# Patient Record
Sex: Female | Born: 1975 | Race: Black or African American | Hispanic: No | Marital: Married | Smoking: Never smoker
Health system: Southern US, Community
[De-identification: ages and names within clinical notes are randomized; demographics above are authoritative.]

## PROBLEM LIST (undated history)

## (undated) DIAGNOSIS — D759 Disease of blood and blood-forming organs, unspecified: Secondary | ICD-10-CM

## (undated) DIAGNOSIS — Z87442 Personal history of urinary calculi: Secondary | ICD-10-CM

## (undated) DIAGNOSIS — N200 Calculus of kidney: Secondary | ICD-10-CM

## (undated) DIAGNOSIS — I10 Essential (primary) hypertension: Secondary | ICD-10-CM

## (undated) DIAGNOSIS — M1990 Unspecified osteoarthritis, unspecified site: Secondary | ICD-10-CM

## (undated) DIAGNOSIS — Z21 Asymptomatic human immunodeficiency virus [HIV] infection status: Secondary | ICD-10-CM

## (undated) DIAGNOSIS — W3400XA Accidental discharge from unspecified firearms or gun, initial encounter: Secondary | ICD-10-CM

## (undated) DIAGNOSIS — B20 Human immunodeficiency virus [HIV] disease: Secondary | ICD-10-CM

## (undated) HISTORY — PX: ABDOMINAL SURGERY: SHX537

---

## 1995-11-25 DIAGNOSIS — W3400XA Accidental discharge from unspecified firearms or gun, initial encounter: Secondary | ICD-10-CM

## 1995-11-25 HISTORY — DX: Accidental discharge from unspecified firearms or gun, initial encounter: W34.00XA

## 1999-02-08 ENCOUNTER — Other Ambulatory Visit: Admission: RE | Admit: 1999-02-08 | Discharge: 1999-02-08 | Payer: Self-pay | Admitting: Obstetrics

## 1999-02-09 ENCOUNTER — Other Ambulatory Visit: Admission: RE | Admit: 1999-02-09 | Discharge: 1999-02-09 | Payer: Self-pay | Admitting: Obstetrics

## 2000-03-27 ENCOUNTER — Encounter (INDEPENDENT_AMBULATORY_CARE_PROVIDER_SITE_OTHER): Payer: Self-pay | Admitting: Specialist

## 2000-03-27 ENCOUNTER — Other Ambulatory Visit: Admission: RE | Admit: 2000-03-27 | Discharge: 2000-03-27 | Payer: Self-pay | Admitting: Obstetrics

## 2000-05-21 ENCOUNTER — Encounter: Admission: RE | Admit: 2000-05-21 | Discharge: 2000-05-21 | Payer: Self-pay | Admitting: Obstetrics

## 2000-06-15 ENCOUNTER — Ambulatory Visit (HOSPITAL_COMMUNITY): Admission: RE | Admit: 2000-06-15 | Discharge: 2000-06-15 | Payer: Self-pay | Admitting: Obstetrics

## 2000-06-15 ENCOUNTER — Encounter (INDEPENDENT_AMBULATORY_CARE_PROVIDER_SITE_OTHER): Payer: Self-pay | Admitting: Specialist

## 2006-10-23 ENCOUNTER — Encounter: Admission: RE | Admit: 2006-10-23 | Discharge: 2006-10-23 | Payer: Self-pay | Admitting: Occupational Medicine

## 2007-06-04 ENCOUNTER — Encounter: Admission: RE | Admit: 2007-06-04 | Discharge: 2007-06-04 | Payer: Self-pay | Admitting: Occupational Medicine

## 2008-08-23 ENCOUNTER — Emergency Department (HOSPITAL_BASED_OUTPATIENT_CLINIC_OR_DEPARTMENT_OTHER): Admission: EM | Admit: 2008-08-23 | Discharge: 2008-08-23 | Payer: Self-pay | Admitting: Emergency Medicine

## 2008-11-18 ENCOUNTER — Emergency Department (HOSPITAL_BASED_OUTPATIENT_CLINIC_OR_DEPARTMENT_OTHER): Admission: EM | Admit: 2008-11-18 | Discharge: 2008-11-18 | Payer: Self-pay | Admitting: Emergency Medicine

## 2009-02-05 ENCOUNTER — Ambulatory Visit: Payer: Self-pay | Admitting: Diagnostic Radiology

## 2009-02-05 ENCOUNTER — Emergency Department (HOSPITAL_BASED_OUTPATIENT_CLINIC_OR_DEPARTMENT_OTHER): Admission: EM | Admit: 2009-02-05 | Discharge: 2009-02-05 | Payer: Self-pay | Admitting: Emergency Medicine

## 2009-06-27 ENCOUNTER — Emergency Department (HOSPITAL_BASED_OUTPATIENT_CLINIC_OR_DEPARTMENT_OTHER): Admission: EM | Admit: 2009-06-27 | Discharge: 2009-06-27 | Payer: Self-pay | Admitting: Emergency Medicine

## 2009-10-15 ENCOUNTER — Emergency Department (HOSPITAL_BASED_OUTPATIENT_CLINIC_OR_DEPARTMENT_OTHER): Admission: EM | Admit: 2009-10-15 | Discharge: 2009-10-15 | Payer: Self-pay | Admitting: Emergency Medicine

## 2009-10-15 ENCOUNTER — Ambulatory Visit: Payer: Self-pay | Admitting: Diagnostic Radiology

## 2010-02-10 ENCOUNTER — Emergency Department (HOSPITAL_BASED_OUTPATIENT_CLINIC_OR_DEPARTMENT_OTHER): Admission: EM | Admit: 2010-02-10 | Discharge: 2010-02-11 | Payer: Self-pay | Admitting: Emergency Medicine

## 2010-04-18 ENCOUNTER — Emergency Department (HOSPITAL_BASED_OUTPATIENT_CLINIC_OR_DEPARTMENT_OTHER): Admission: EM | Admit: 2010-04-18 | Discharge: 2010-04-18 | Payer: Self-pay | Admitting: Emergency Medicine

## 2010-06-07 ENCOUNTER — Emergency Department (HOSPITAL_BASED_OUTPATIENT_CLINIC_OR_DEPARTMENT_OTHER): Admission: EM | Admit: 2010-06-07 | Discharge: 2010-06-07 | Payer: Self-pay | Admitting: Emergency Medicine

## 2010-07-21 ENCOUNTER — Emergency Department (HOSPITAL_BASED_OUTPATIENT_CLINIC_OR_DEPARTMENT_OTHER): Admission: EM | Admit: 2010-07-21 | Discharge: 2010-07-21 | Payer: Self-pay | Admitting: Emergency Medicine

## 2010-07-21 ENCOUNTER — Ambulatory Visit: Payer: Self-pay | Admitting: Diagnostic Radiology

## 2010-08-02 ENCOUNTER — Emergency Department (HOSPITAL_BASED_OUTPATIENT_CLINIC_OR_DEPARTMENT_OTHER): Admission: EM | Admit: 2010-08-02 | Discharge: 2010-08-03 | Payer: Self-pay | Admitting: Emergency Medicine

## 2010-08-03 ENCOUNTER — Ambulatory Visit: Payer: Self-pay | Admitting: Diagnostic Radiology

## 2010-12-26 ENCOUNTER — Emergency Department (HOSPITAL_BASED_OUTPATIENT_CLINIC_OR_DEPARTMENT_OTHER)
Admission: EM | Admit: 2010-12-26 | Discharge: 2010-12-27 | Disposition: A | Discharge status: Home / Self Care | Payer: Self-pay | Attending: Emergency Medicine | Admitting: Emergency Medicine

## 2010-12-26 DIAGNOSIS — L509 Urticaria, unspecified: Secondary | ICD-10-CM | POA: Insufficient documentation

## 2011-02-06 LAB — CBC
HCT: 34.3 % — ABNORMAL LOW (ref 36.0–46.0)
Hemoglobin: 11.7 g/dL — ABNORMAL LOW (ref 12.0–15.0)
MCV: 90.5 fL (ref 78.0–100.0)
RDW: 13.5 % (ref 11.5–15.5)
WBC: 6.6 10*3/uL (ref 4.0–10.5)

## 2011-02-06 LAB — LIPASE, BLOOD: Lipase: 52 U/L (ref 23–300)

## 2011-02-06 LAB — COMPREHENSIVE METABOLIC PANEL
ALT: 20 U/L (ref 0–35)
Alkaline Phosphatase: 59 U/L (ref 39–117)
BUN: 9 mg/dL (ref 6–23)
CO2: 29 mEq/L (ref 19–32)
Chloride: 106 mEq/L (ref 96–112)
GFR calc non Af Amer: >60 mL/min (ref >60)
Glucose, Bld: 95 mg/dL (ref 70–99)
Potassium: 4 mEq/L (ref 3.5–5.1)
Total Bilirubin: 1 mg/dL (ref 0.3–1.2)
Total Protein: 7.7 g/dL (ref 6.0–8.3)

## 2011-02-06 LAB — URINALYSIS, ROUTINE W REFLEX MICROSCOPIC
Glucose, UA: NEGATIVE mg/dL
Hgb urine dipstick: NEGATIVE
Protein, ur: NEGATIVE mg/dL
pH: 5.5 (ref 5.0–8.0)

## 2011-02-06 LAB — DIFFERENTIAL
Basophils Absolute: 0 10*3/uL (ref 0.0–0.1)
Basophils Relative: 1 % (ref 0–1)
Eosinophils Absolute: 0.1 10*3/uL (ref 0.0–0.7)
Neutro Abs: 3.9 10*3/uL (ref 1.7–7.7)
Neutrophils Relative %: 58 % (ref 43–77)

## 2011-02-06 LAB — WET PREP, GENITAL
Trich, Wet Prep: NONE SEEN
Yeast Wet Prep HPF POC: NONE SEEN

## 2011-04-11 NOTE — Op Note (Signed)
[facility]  Patient:    Robin Orozco, Robin Orozco                      MRN: [id number] Proc. Date: 06/15/00 Adm. Date:  [id number] Attending:  Tammi Sou                           Operative Report  PREOPERATIVE DIAGNOSIS:       Severe cervical dysplasia (CIN 3).  POSTOPERATIVE DIAGNOSIS:      Severe cervical dysplasia (CIN 3).  OPERATION:                    Loop electrosurgical excision procedure.  SURGEON:                      Charles A. Clearance Coots, M.D.  ASSISTANT:  ANESTHESIA:                   MAC with paracervical block.  ESTIMATED BLOOD LOSS:         Negligible.  COMPLICATIONS:                None.  SPECIMENS:                    Conization of cervix.  DESCRIPTION OF PROCEDURE:     The patient was brought to the operating room and after satisfactory IV sedation, the legs were brought up in stirrups and the vagina was prepped and draped in the usual sterile fashion.  A sterile LEEP speculum was inserted in the vaginal vault and the cervix was isolated. Solution of 2% Xylocaine with 1:200,000 epinephrine was injected in the cervical stroma throughout for hemostasis and analgesia.  A 15 x 12 mm loop was then used to obtain conization of the cervix in routine fashion at a power wattage of 75 watts cutting and 50 watts coagulation.  The specimen was labeled at 12 oclock with a stitch of Vicryl suture and was submitted to pathology for evaluation.  The edges of the conization along the base were further coagulated with ball-tip electrode and hemostasis was excellent at the conclusion of the procedure.  All instruments were retired.  The patient tolerated the procedure well and was transported to the recovery room in satisfactory condition. DD:  06/15/00 TD:  06/17/00 Job: [id number]

## 2011-04-22 ENCOUNTER — Emergency Department (HOSPITAL_BASED_OUTPATIENT_CLINIC_OR_DEPARTMENT_OTHER)
Admission: EM | Admit: 2011-04-22 | Discharge: 2011-04-22 | Disposition: A | Discharge status: Home / Self Care | Payer: Self-pay | Attending: Emergency Medicine | Admitting: Emergency Medicine

## 2011-04-22 DIAGNOSIS — K089 Disorder of teeth and supporting structures, unspecified: Secondary | ICD-10-CM | POA: Insufficient documentation

## 2011-05-27 ENCOUNTER — Emergency Department (HOSPITAL_BASED_OUTPATIENT_CLINIC_OR_DEPARTMENT_OTHER)
Admission: EM | Admit: 2011-05-27 | Discharge: 2011-05-28 | Disposition: A | Discharge status: Home / Self Care | Payer: PRIVATE HEALTH INSURANCE | Attending: Emergency Medicine | Admitting: Emergency Medicine

## 2011-05-27 ENCOUNTER — Emergency Department (INDEPENDENT_AMBULATORY_CARE_PROVIDER_SITE_OTHER): Payer: PRIVATE HEALTH INSURANCE

## 2011-05-27 DIAGNOSIS — M79609 Pain in unspecified limb: Secondary | ICD-10-CM

## 2011-05-27 DIAGNOSIS — M545 Low back pain, unspecified: Secondary | ICD-10-CM

## 2011-09-15 ENCOUNTER — Emergency Department (INDEPENDENT_AMBULATORY_CARE_PROVIDER_SITE_OTHER): Payer: PRIVATE HEALTH INSURANCE

## 2011-09-15 ENCOUNTER — Encounter: Payer: Self-pay | Admitting: *Deleted

## 2011-09-15 ENCOUNTER — Emergency Department (HOSPITAL_BASED_OUTPATIENT_CLINIC_OR_DEPARTMENT_OTHER)
Admission: EM | Admit: 2011-09-15 | Discharge: 2011-09-15 | Disposition: A | Discharge status: Home / Self Care | Payer: PRIVATE HEALTH INSURANCE | Attending: Emergency Medicine | Admitting: Emergency Medicine

## 2011-09-15 DIAGNOSIS — R51 Headache: Secondary | ICD-10-CM

## 2011-09-15 DIAGNOSIS — R519 Headache, unspecified: Secondary | ICD-10-CM

## 2011-09-15 LAB — CSF CELL COUNT WITH DIFFERENTIAL
RBC Count, CSF: 43 /mm3 — ABNORMAL HIGH
Tube #: 1
Tube #: 4
WBC, CSF: 1 /mm3 (ref 0–5)

## 2011-09-15 LAB — PROTEIN, CSF: Total  Protein, CSF: 26 mg/dL (ref 15–45)

## 2011-09-15 LAB — GRAM STAIN

## 2011-09-15 MED ORDER — METOCLOPRAMIDE HCL 5 MG/ML IJ SOLN
10.0000 mg | Freq: Once | INTRAMUSCULAR | Status: AC
  Administered 2011-09-15: 10 mg via INTRAVENOUS
  Filled 2011-09-15: qty 2

## 2011-09-15 MED ORDER — DEXAMETHASONE SODIUM PHOSPHATE 10 MG/ML IJ SOLN
10.0000 mg | Freq: Once | INTRAMUSCULAR | Status: AC
  Administered 2011-09-15: 10 mg via INTRAVENOUS
  Filled 2011-09-15: qty 1

## 2011-09-15 MED ORDER — DIPHENHYDRAMINE HCL 50 MG/ML IJ SOLN
25.0000 mg | Freq: Once | INTRAMUSCULAR | Status: AC
  Administered 2011-09-15: 25 mg via INTRAVENOUS
  Filled 2011-09-15: qty 1

## 2011-09-15 MED ORDER — SODIUM CHLORIDE 0.9 % IV BOLUS (SEPSIS)
1000.0000 mL | Freq: Once | INTRAVENOUS | Status: AC
  Administered 2011-09-15: 1000 mL via INTRAVENOUS

## 2011-09-15 MED ORDER — METOCLOPRAMIDE HCL 5 MG/ML IJ SOLN
INTRAMUSCULAR | Status: AC
  Administered 2011-09-15: 10 mg
  Filled 2011-09-15: qty 2

## 2011-09-15 NOTE — ED Provider Notes (Addendum)
History     CSN: [id number] Arrival date & time: 09/15/2011  1:41 AM   First MD Initiated Contact with Patient 09/15/11 0240      Chief Complaint  Patient presents with  . Headache    (Consider location/radiation/quality/duration/timing/severity/associated sxs/prior treatment) Patient is a 35 y.o. female presenting with headaches. The history is provided by the patient.  Headache  This is a new problem. Pertinent negatives include no fever, no palpitations, no shortness of breath and no vomiting.   sudden onset headache, hurts all over with no history of similar headaches in the past. Severe in nature. Described as sharp and throbbing. Constant since onset today. No radiation. No neck pain or fevers. No rash or sore throat. No recent illness. Patient is still nauseated but has not vomited. She also has recurrent right lower back pain and today without trauma that she does not believe is associated with her headache. No incontinence or weakness or numbness.  Onset: today Location: entire head Radiation: none Quality: sharp/ throbbing Timing: constant Duration: persistent since onset Severity: 10/10 Associated symptoms: nausea No prior history of same.   History reviewed. No pertinent past medical history.  History reviewed. No pertinent past surgical history.  No family history on file.  History  Substance Use Topics  . Smoking status: Never Smoker   . Smokeless tobacco: Not on file  . Alcohol Use: No    OB History    Grav Para Term Preterm Abortions TAB SAB Ect Mult Living                  Review of Systems  Constitutional: Negative for fever and chills.  HENT: Negative for neck pain, neck stiffness and sinus pressure.   Eyes: Negative for pain.  Respiratory: Negative for cough and shortness of breath.   Cardiovascular: Negative for chest pain, palpitations and leg swelling.  Gastrointestinal: Negative for vomiting and abdominal pain.  Genitourinary: Negative  for dysuria.  Musculoskeletal: Negative for back pain.  Skin: Negative for rash.  Neurological: Positive for headaches. Negative for syncope, speech difficulty and numbness.  All other systems reviewed and are negative.    Allergies  Aspirin and Toradol  Home Medications  No current outpatient prescriptions on file.  BP 121/73  Pulse 93  Temp(Src) 98.5 F (36.9 C) (Oral)  Resp 18  SpO2 99%  Physical Exam  Constitutional: She is oriented to person, place, and time. She appears well-developed and well-nourished.  HENT:  Head: Normocephalic and atraumatic.  Eyes: Conjunctivae and EOM are normal. Pupils are equal, round, and reactive to light.  Neck: Full passive range of motion without pain. Neck supple. No thyromegaly present.       No meningismus  Cardiovascular: Normal rate, regular rhythm, S1 normal, S2 normal and intact distal pulses.   Pulmonary/Chest: Effort normal and breath sounds normal.  Abdominal: Soft. Bowel sounds are normal. There is no tenderness. There is no CVA tenderness.  Musculoskeletal: Normal range of motion.  Neurological: She is alert and oriented to person, place, and time. She has normal strength and normal reflexes. No cranial nerve deficit or sensory deficit. She displays a negative Romberg sign. GCS eye subscore is 4. GCS verbal subscore is 5. GCS motor subscore is 6.       Normal Gait  Skin: Skin is warm and dry. No rash noted. No cyanosis. Nails show no clubbing.  Psychiatric: She has a normal mood and affect. Her speech is normal and behavior is normal.  ED Course  LUMBAR PUNCTURE Date/Time: 09/15/2011 5:15 AM Performed by: Sunnie Nielsen Authorized by: Sunnie Nielsen Consent: Verbal consent obtained. Risks and benefits: risks, benefits and alternatives were discussed Consent given by: patient Patient understanding: patient states understanding of the procedure being performed Patient consent: the patient's understanding of the procedure  matches consent given Procedure consent: procedure consent matches procedure scheduled Relevant documents: relevant documents present and verified Test results: test results available and properly labeled Site marked: the operative site was marked Imaging studies: imaging studies available Patient identity confirmed: verbally with patient Time out: Immediately prior to procedure a "time out" was called to verify the correct patient, procedure, equipment, support staff and site/side marked as required. Indications: evaluation for subarachnoid hemorrhage Anesthesia: local infiltration Local anesthetic: lidocaine 1% without epinephrine Anesthetic total: 2 ml Preparation: Patient was prepped and draped in the usual sterile fashion. Lumbar space: L4-L5 interspace Patient's position: sitting Needle gauge: 20 Needle type: spinal needle - Quincke tip Number of attempts: 1 Fluid appearance: clear Tubes of fluid: 4 Total volume: 4 ml Post-procedure: site cleaned and adhesive bandage applied Patient tolerance: Patient tolerated the procedure well with no immediate complications.   (including critical care time)  Results for orders placed during the hospital encounter of 09/15/11  CSF CELL COUNT WITH DIFFERENTIAL      Component Value Range   Tube # 1     Color, CSF COLORLESS  COLORLESS    Appearance, CSF CLEAR  CLEAR    Supernatant NOT INDICATED     RBC Count 43 (*) 0 (/cu mm)   WBC, CSF 1  0 - 5 (/cu mm)   Segmented Neutrophils-CSF RARE  0 - 6 (%)   Monocyte-Macrophage-Spinal Fluid RARE  15 - 45 (%)  GLUCOSE, CSF      Component Value Range   Glucose, CSF 70  43 - 76 (mg/dL)  PROTEIN, CSF      Component Value Range   Total  Protein, CSF 26  15 - 45 (mg/dL)  CSF CELL COUNT WITH DIFFERENTIAL      Component Value Range   Tube # 4     Color, CSF COLORLESS  COLORLESS    Appearance, CSF CLEAR  CLEAR    Supernatant NOT INDICATED     RBC Count 1 (*) 0 (/cu mm)   WBC, CSF 0  0 - 5 (/cu  mm)   Segmented Neutrophils-CSF TOO FEW TO COUNT, SMEAR AVAILABLE FOR REVIEW  0 - 6 (%)   Monocyte-Macrophage-Spinal Fluid RARE  15 - 45 (%)  GRAM STAIN      Component Value Range   Specimen Description CSF     Special Requests NONE     Gram Stain       Value: CYTOSPIN PREP SLIDE     WBC PRESENT, PREDOMINANTLY MONONUCLEAR     NO ORGANISMS SEEN   Report Status 09/15/2011 FINAL     Ct Head Wo Contrast  09/15/2011  *RADIOLOGY REPORT*  Clinical Data: Headache.  CT HEAD WITHOUT CONTRAST  Technique:  Contiguous axial images were obtained from the base of the skull through the vertex without contrast.  Comparison: None.  Findings: There is no evidence of acute infarction, mass lesion, or intra- or extra-axial hemorrhage on CT.  The posterior fossa, including the cerebellum, brainstem and fourth ventricle, is within normal limits.  The third and lateral ventricles, and basal ganglia are unremarkable in appearance.  The cerebral hemispheres are symmetric in appearance, with normal gray- white differentiation.  No mass effect or midline shift is seen.  There is no evidence of fracture; visualized osseous structures are unremarkable in appearance.  The visualized portions of the orbits are within normal limits.  The paranasal sinuses and mastoid air cells are well-aerated.  No significant soft tissue abnormalities are seen.  IMPRESSION: Unremarkable noncontrast CT of the head.  Original Report Authenticated By: Tonia Ghent, M.D.          MDM   Sudden onset severe headache with no history of same. Patient states she has a cousin with history of aneurysm. CT scan obtained and reviewed as above and given concern for possible subarachnoid hemorrhage, patient was consented for LP. Clear CSF obtained and sent to lab for microscopic evaluation. IV fluids and had a cocktail provided which improved her symptoms. No meningismus or neuro deficits.        Sunnie Nielsen, MD 09/15/11 1610  Sunnie Nielsen,  MD 09/18/11 [phone]

## 2011-09-15 NOTE — ED Notes (Signed)
Patient states she has a headache, feels "sick to her stomach"  And has pain going down her right leg.

## 2011-09-15 NOTE — ED Notes (Signed)
Pt returned from CT °

## 2011-09-15 NOTE — ED Notes (Signed)
Pt resting comfortably with eyes closed resp even and non-labored pain improved delay explained will continue to monitor

## 2011-09-15 NOTE — ED Notes (Signed)
Pt ambulated without difficulty alert and oriented BP improved pain improved will continue to monitor

## 2011-09-16 ENCOUNTER — Emergency Department (HOSPITAL_BASED_OUTPATIENT_CLINIC_OR_DEPARTMENT_OTHER): Payer: PRIVATE HEALTH INSURANCE

## 2011-09-16 ENCOUNTER — Emergency Department (HOSPITAL_BASED_OUTPATIENT_CLINIC_OR_DEPARTMENT_OTHER)
Admission: EM | Admit: 2011-09-16 | Discharge: 2011-09-16 | Disposition: A | Discharge status: Home / Self Care | Payer: PRIVATE HEALTH INSURANCE | Attending: Emergency Medicine | Admitting: Emergency Medicine

## 2011-09-16 ENCOUNTER — Emergency Department (INDEPENDENT_AMBULATORY_CARE_PROVIDER_SITE_OTHER): Payer: PRIVATE HEALTH INSURANCE

## 2011-09-16 ENCOUNTER — Encounter (HOSPITAL_BASED_OUTPATIENT_CLINIC_OR_DEPARTMENT_OTHER): Payer: Self-pay | Admitting: *Deleted

## 2011-09-16 DIAGNOSIS — Y849 Medical procedure, unspecified as the cause of abnormal reaction of the patient, or of later complication, without mention of misadventure at the time of the procedure: Secondary | ICD-10-CM

## 2011-09-16 DIAGNOSIS — M5126 Other intervertebral disc displacement, lumbar region: Secondary | ICD-10-CM | POA: Insufficient documentation

## 2011-09-16 DIAGNOSIS — M545 Low back pain, unspecified: Secondary | ICD-10-CM

## 2011-09-16 DIAGNOSIS — G971 Other reaction to spinal and lumbar puncture: Secondary | ICD-10-CM | POA: Insufficient documentation

## 2011-09-16 DIAGNOSIS — M5116 Intervertebral disc disorders with radiculopathy, lumbar region: Secondary | ICD-10-CM

## 2011-09-16 DIAGNOSIS — M549 Dorsalgia, unspecified: Secondary | ICD-10-CM | POA: Insufficient documentation

## 2011-09-16 MED ORDER — OXYCODONE-ACETAMINOPHEN 7.5-325 MG PO TABS: 1.0000 | ORAL_TABLET | ORAL | Status: AC | PRN

## 2011-09-16 MED ORDER — HYDROMORPHONE HCL 1 MG/ML IJ SOLN
1.0000 mg | Freq: Once | INTRAMUSCULAR | Status: AC
  Administered 2011-09-16: 1 mg via INTRAVENOUS
  Filled 2011-09-16: qty 1

## 2011-09-16 MED ORDER — GADOBENATE DIMEGLUMINE 529 MG/ML IV SOLN
18.0000 mL | Freq: Once | INTRAVENOUS | Status: AC | PRN
Start: 2011-09-16 — End: 2011-09-16
  Administered 2011-09-16: 18 mL via INTRAVENOUS

## 2011-09-16 MED ORDER — DIAZEPAM 5 MG PO TABS: 5.0000 mg | ORAL_TABLET | Freq: Two times a day (BID) | ORAL | Status: AC

## 2011-09-16 MED ORDER — ONDANSETRON 8 MG PO TBDP
ORAL_TABLET | ORAL | Status: AC
  Administered 2011-09-16: 8 mg via ORAL
  Filled 2011-09-16: qty 1

## 2011-09-16 MED ORDER — ONDANSETRON HCL 4 MG/2ML IJ SOLN
4.0000 mg | Freq: Once | INTRAMUSCULAR | Status: AC
  Administered 2011-09-16: 4 mg via INTRAVENOUS
  Filled 2011-09-16: qty 2

## 2011-09-16 MED ORDER — SODIUM CHLORIDE 0.9 % IV BOLUS (SEPSIS)
1000.0000 mL | Freq: Once | INTRAVENOUS | Status: AC
  Administered 2011-09-16: 1000 mL via INTRAVENOUS

## 2011-09-16 MED ORDER — ONDANSETRON 8 MG PO TBDP
8.0000 mg | ORAL_TABLET | Freq: Once | ORAL | Status: AC
  Administered 2011-09-16: 8 mg via ORAL

## 2011-09-16 NOTE — ED Notes (Signed)
Pt returned from MRI via w/c.

## 2011-09-16 NOTE — ED Notes (Signed)
Pt requested and up to BR between med admn-slow steady gait

## 2011-09-16 NOTE — ED Notes (Signed)
Pt taken to MRI via w/c-tech was advised pt given pain med/dilaudid and nausea med/zofran

## 2011-09-16 NOTE — ED Provider Notes (Signed)
History     CSN: [id number] Arrival date & time: 09/16/2011  3:10 PM   First MD Initiated Contact with Patient 09/16/11 1523      Chief Complaint  Patient presents with  . Back Pain    (Consider location/radiation/quality/duration/timing/severity/associated sxs/prior treatment) Patient is a 35 y.o. female presenting with back pain. The history is provided by the patient.  Back Pain    patient seen yesterday for headache and had a lumbar puncture. Since that time she has had lower back pain radiating down to her legs. She denies any fever or vomiting denies any photophobia and headache is the same. No loss of bowel or bladder function. Pain is localized to her lower lumbar sacral area and radiates to both legs is described as being moderate and sharp. no medications taken for pain prior to arrival.  History reviewed. No pertinent past medical history.  History reviewed. No pertinent past surgical history.  History reviewed. No pertinent family history.  History  Substance Use Topics  . Smoking status: Never Smoker   . Smokeless tobacco: Not on file  . Alcohol Use: No    OB History    Grav Para Term Preterm Abortions TAB SAB Ect Mult Living                  Review of Systems  Musculoskeletal: Positive for back pain.  All other systems reviewed and are negative.    Allergies  Toradol and Aspirin  Home Medications   Current Outpatient Rx  Name Route Sig Dispense Refill  . IBUPROFEN 200 MG PO TABS Oral Take 800 mg by mouth every 6 (six) hours as needed. For pain     . VISINE OP Right Eye Place 1 drop into the right eye daily as needed. For irritated eyes       BP 131/71  Pulse 82  Temp(Src) 98.7 F (37.1 C) (Oral)  Resp 16  Ht 5\' 6"  (1.676 m)  Wt 195 lb (88.451 kg)  BMI 31.47 kg/m2  SpO2 100%  LMP 08/16/2011  Physical Exam  Nursing note and vitals reviewed. Constitutional: She is oriented to person, place, and time. Vital signs are normal. She appears  well-developed and well-nourished.  Non-toxic appearance. No distress.  HENT:  Head: Normocephalic and atraumatic.  Eyes: Conjunctivae and EOM are normal. Pupils are equal, round, and reactive to light.  Neck: Normal range of motion. Neck supple. No tracheal deviation present.  Cardiovascular: Normal rate, regular rhythm and normal heart sounds.  Exam reveals no gallop.   No murmur heard. Pulmonary/Chest: Effort normal and breath sounds normal. No stridor. No respiratory distress. She has no wheezes.  Abdominal: Soft. Normal appearance and bowel sounds are normal. She exhibits no distension. There is no tenderness. There is no rebound.  Musculoskeletal: Normal range of motion. She exhibits no edema and no tenderness.       Lumbar back: She exhibits tenderness, pain and spasm. She exhibits no bony tenderness.       Back:  Neurological: She is alert and oriented to person, place, and time. She has normal strength. She displays no tremor and normal reflexes. No cranial nerve deficit or sensory deficit. She exhibits normal muscle tone. GCS eye subscore is 4. GCS verbal subscore is 5. GCS motor subscore is 6. She displays no Babinski's sign on the right side. She displays no Babinski's sign on the left side.  Reflex Scores:      Patellar reflexes are 4+ on the right side  and 4+ on the left side. Skin: Skin is warm and dry. No rash noted.  Psychiatric: She has a normal mood and affect. Her speech is normal and behavior is normal.    ED Course  Procedures (including critical care time)  Labs Reviewed - No data to display Ct Head Wo Contrast  09/15/2011  *RADIOLOGY REPORT*  Clinical Data: Headache.  CT HEAD WITHOUT CONTRAST  Technique:  Contiguous axial images were obtained from the base of the skull through the vertex without contrast.  Comparison: None.  Findings: There is no evidence of acute infarction, mass lesion, or intra- or extra-axial hemorrhage on CT.  The posterior fossa, including the  cerebellum, brainstem and fourth ventricle, is within normal limits.  The third and lateral ventricles, and basal ganglia are unremarkable in appearance.  The cerebral hemispheres are symmetric in appearance, with normal gray- white differentiation.  No mass effect or midline shift is seen.  There is no evidence of fracture; visualized osseous structures are unremarkable in appearance.  The visualized portions of the orbits are within normal limits.  The paranasal sinuses and mastoid air cells are well-aerated.  No significant soft tissue abnormalities are seen.  IMPRESSION: Unremarkable noncontrast CT of the head.  Original Report Authenticated By: Tonia Ghent, M.D.     No diagnosis found.    MDM  3:54 PM Patient's CSF results were reviewed from yesterday and are negative at 24 hours      8:30 PM Pt rechecked and feels better after pain medications--repeat neuro exam stable, awaiting mri results 8:56 PM Ct Head Wo Contrast  09/15/2011  *RADIOLOGY REPORT*  Clinical Data: Headache.  CT HEAD WITHOUT CONTRAST  Technique:  Contiguous axial images were obtained from the base of the skull through the vertex without contrast.  Comparison: None.  Findings: There is no evidence of acute infarction, mass lesion, or intra- or extra-axial hemorrhage on CT.  The posterior fossa, including the cerebellum, brainstem and fourth ventricle, is within normal limits.  The third and lateral ventricles, and basal ganglia are unremarkable in appearance.  The cerebral hemispheres are symmetric in appearance, with normal gray- white differentiation.  No mass effect or midline shift is seen.  There is no evidence of fracture; visualized osseous structures are unremarkable in appearance.  The visualized portions of the orbits are within normal limits.  The paranasal sinuses and mastoid air cells are well-aerated.  No significant soft tissue abnormalities are seen.  IMPRESSION: Unremarkable noncontrast CT of the head.   Original Report Authenticated By: Tonia Ghent, M.D.   Patient's MRI results do not show any signs of infection however did show disc protrusion at the L4-L5 area. Patient feels better will be discharged home  Toy Baker, MD 09/16/11 2057

## 2011-09-16 NOTE — ED Notes (Signed)
Pt c/o here yesterday for h/a and received a spinal tap, since then c/o lower back pain and bil leg pain

## 2011-09-16 NOTE — ED Notes (Signed)
Pt given saltines and sprite per request after ok by EDP

## 2011-09-16 NOTE — ED Notes (Signed)
Called into pt's room-pt c/o cont'd nausea and feeling hot-VSS-pt with 2 blankets-1 blanket removed-EDP notified of pt c/o

## 2011-09-18 LAB — CSF CULTURE W GRAM STAIN: Culture: NO GROWTH

## 2011-11-25 DIAGNOSIS — N200 Calculus of kidney: Secondary | ICD-10-CM

## 2011-11-25 HISTORY — DX: Calculus of kidney: N20.0

## 2012-04-07 ENCOUNTER — Emergency Department (HOSPITAL_BASED_OUTPATIENT_CLINIC_OR_DEPARTMENT_OTHER)
Admission: EM | Admit: 2012-04-07 | Discharge: 2012-04-07 | Disposition: A | Discharge status: Home / Self Care | Payer: PRIVATE HEALTH INSURANCE | Attending: Emergency Medicine | Admitting: Emergency Medicine

## 2012-04-07 ENCOUNTER — Encounter (HOSPITAL_BASED_OUTPATIENT_CLINIC_OR_DEPARTMENT_OTHER): Payer: Self-pay

## 2012-04-07 ENCOUNTER — Emergency Department (HOSPITAL_BASED_OUTPATIENT_CLINIC_OR_DEPARTMENT_OTHER): Payer: PRIVATE HEALTH INSURANCE

## 2012-04-07 DIAGNOSIS — S8000XA Contusion of unspecified knee, initial encounter: Secondary | ICD-10-CM

## 2012-04-07 DIAGNOSIS — M7989 Other specified soft tissue disorders: Secondary | ICD-10-CM | POA: Insufficient documentation

## 2012-04-07 DIAGNOSIS — IMO0002 Reserved for concepts with insufficient information to code with codable children: Secondary | ICD-10-CM | POA: Insufficient documentation

## 2012-04-07 DIAGNOSIS — M25569 Pain in unspecified knee: Secondary | ICD-10-CM | POA: Insufficient documentation

## 2012-04-07 MED ORDER — HYDROCODONE-ACETAMINOPHEN 5-500 MG PO TABS: 1.0000 | ORAL_TABLET | Freq: Four times a day (QID) | ORAL | Status: AC | PRN

## 2012-04-07 NOTE — ED Notes (Signed)
MD at bedside. 

## 2012-04-07 NOTE — ED Provider Notes (Addendum)
History     CSN: [id number]  Arrival date & time 04/07/12  4098   None     No chief complaint on file.   (Consider location/radiation/quality/duration/timing/severity/associated sxs/prior treatment) Patient is a 36 y.o. female presenting with knee pain. The history is provided by the patient.  Knee Pain This is a new problem. The current episode started yesterday (She was cleaning her bathroom yesterday and went to rush out and hit her knee on the toilet). The problem occurs constantly. The problem has been gradually worsening. Associated symptoms comments: No fever.  Pain with walking adn mild swelling.  No deformity. The symptoms are aggravated by bending, walking and standing. The symptoms are relieved by ice and rest. She has tried rest for the symptoms. The treatment provided mild relief.    No past medical history on file.  No past surgical history on file.  No family history on file.  History  Substance Use Topics  . Smoking status: Never Smoker   . Smokeless tobacco: Not on file  . Alcohol Use: No    OB History    Grav Para Term Preterm Abortions TAB SAB Ect Mult Living                  Review of Systems  Constitutional: Negative for fever.  Musculoskeletal: Positive for arthralgias. Negative for back pain and joint swelling.  All other systems reviewed and are negative.    Allergies  Ketorolac tromethamine and Aspirin  Home Medications   Current Outpatient Rx  Name Route Sig Dispense Refill  . IBUPROFEN 200 MG PO TABS Oral Take 800 mg by mouth every 6 (six) hours as needed. For pain     . VISINE OP Right Eye Place 1 drop into the right eye daily as needed. For irritated eyes       BP 161/84  Pulse 77  Temp(Src) 98 F (36.7 C) (Oral)  Resp 20  Ht 5\' 5"  (1.651 m)  Wt 180 lb (81.647 kg)  BMI 29.95 kg/m2  SpO2 100%  Physical Exam  Nursing note and vitals reviewed. Constitutional: She is oriented to person, place, and time. She appears  well-developed and well-nourished. She appears distressed.  HENT:  Head: Normocephalic and atraumatic.  Eyes: EOM are normal. Pupils are equal, round, and reactive to light.  Musculoskeletal: Normal range of motion. She exhibits tenderness.       Right knee: She exhibits swelling and bony tenderness. She exhibits no effusion and no deformity. tenderness found. Medial joint line and lateral joint line tenderness noted.       No edema  Neurological: She is alert and oriented to person, place, and time. No cranial nerve deficit.  Skin: Skin is warm and dry. No rash noted.  Psychiatric: She has a normal mood and affect. Her behavior is normal.    ED Course  Procedures (including critical care time)  Labs Reviewed - No data to display Dg Knee Complete 4 Views Right  04/07/2012  *RADIOLOGY REPORT*  Clinical Data: Pain and swelling, injury, struck knee on a toilet  RIGHT KNEE - COMPLETE 4+ VIEW  Comparison: None  Findings: Bone mineralization normal. Joint spaces preserved. No fracture, dislocation, or bone destruction. No joint effusion.  IMPRESSION: No acute osseous abnormalities.  Original Report Authenticated By: Lollie Marrow, M.D.     1. Knee contusion       MDM   Patient had a mechanical injury to the right knee yesterday while cleaning her bathroom  she hit it on the toilet. It was extremely painful at first but then it improved slightly and she was able to ambulate however today the pain is much worse and it is painful with walking or bearing weight. In the past patient has broken multiple bones in a leg and was concerned that she may have injured it. On exam she has tenderness over the superior and inferior patella as well as the medial and lateral joint line. There is a small amount of swelling but no deformity. Neurovascularly intact normal tendon function.   9:52 AM  Plain films wnl.  Will d/c home with pain control.     Gwyneth Sprout, MD 04/07/12 2956  Gwyneth Sprout, MD 04/07/12 [phone]

## 2012-04-07 NOTE — ED Notes (Signed)
Pt reports injury to right knee.

## 2012-04-07 NOTE — Discharge Instructions (Signed)
Bone Bruise  A bone bruise is a small hidden fracture of the bone. It typically occurs with bones located close to the surface of the skin.  SYMPTOMS  The pain lasts longer than a normal bruise.   The bruised area is difficult to use.   There may be discoloration or swelling of the bruised area.   When a bone bruise is found with injury to the anterior cruciate ligament (in the knee) there is often an increased:   Amount of fluid in the knee   Time the fluid in the knee lasts.   Number of days until you are walking normally and regaining the motion you had before the injury.   Number of days with pain from the injury.  DIAGNOSIS  It can only be seen on X-rays known as MRIs. This stands for magnetic resonance imaging. A regular X-ray taken of a bone bruise would appear to be normal. A bone bruise is a common injury in the knee and the heel bone (calcaneus). The problems are similar to those produced by stress fractures, which are bone injuries caused by overuse. A bone bruise may also be a sign of other injuries. For example, bone bruises are commonly found where an anterior cruciate ligament (ACL) in the knee has been pulled away from the bone (ruptured). A ligament is a tough fibrous material that connects bones together to make our joints stable. Bruises of the bone last a lot longer than bruises of the muscle or tissues beneath the skin. Bone bruises can last from days to months and are often more severe and painful than other bruises. TREATMENT Because bone bruises are sudden injuries you cannot often prevent them, other than by being extremely careful. Some things you can do to improve the condition are:  Apply ice to the sore area for 15 to 20 minutes, 3 to 4 times per day while awake for the first 2 days. Put the ice in a plastic bag, and place a towel between the bag of ice and your skin.   Keep your bruised area raised (elevated) when possible to lessen swelling.   For activity:     Use crutches when necessary; do not put weight on the injured leg until you are no longer tender.   You may walk on your affected part as the pain allows, or as instructed.   Start weight bearing gradually on the bruised part.   Continue to use crutches or a cane until you can stand without causing pain, or as instructed.   If a plaster splint was applied, wear the splint until you are seen for a follow-up examination. Rest it on nothing harder than a pillow the first 24 hours. Do not put weight on it. Do not get it wet. You may take it off to take a shower or bath.   If an air splint was applied, more air may be blown into or out of the splint as needed for comfort. You may take it off at night and to take a shower or bath.   Wiggle your toes in the splint several times per day if you are able.   You may have been given an elastic bandage to use with the plaster splint or alone. The splint is too tight if you have numbness, tingling or if your foot becomes cold and blue. Adjust the bandage to make it comfortable.   Only take over-the-counter or prescription medicines for pain, discomfort, or fever as directed by   your caregiver.   Follow all instructions for follow up with your caregiver. This includes any orthopedic referrals, physical therapy, and rehabilitation. Any delay in obtaining necessary care could result in a delay or failure of the bones to heal.  SEEK MEDICAL CARE IF:   You have an increase in bruising, swelling, or pain.   You notice coldness of your toes.   You do not get pain relief with medications.  SEEK IMMEDIATE MEDICAL CARE IF:   Your toes are numb or blue.   You have severe pain not controlled with medications.   If any of the problems that caused you to seek care are becoming worse.  Document Released: 01/31/2004 Document Revised: 10/30/2011 Document Reviewed: 06/14/2008 [employer] Patient Information 2012 [employer].  Contusion A contusion is a deep  bruise. Contusions are the result of an injury that caused bleeding under the skin. The contusion may turn blue, purple, or yellow. Minor injuries will give you a painless contusion, but more severe contusions may stay painful and swollen for a few weeks.  CAUSES  A contusion is usually caused by a blow, trauma, or direct force to an area of the body. SYMPTOMS   Swelling and redness of the injured area.   Bruising of the injured area.   Tenderness and soreness of the injured area.   Pain.  DIAGNOSIS  The diagnosis can be made by taking a history and physical exam. An X-ray, CT scan, or MRI may be needed to determine if there were any associated injuries, such as fractures. TREATMENT  Specific treatment will depend on what area of the body was injured. In general, the best treatment for a contusion is resting, icing, elevating, and applying cold compresses to the injured area. Over-the-counter medicines may also be recommended for pain control. Ask your caregiver what the best treatment is for your contusion. HOME CARE INSTRUCTIONS   Put ice on the injured area.   Put ice in a plastic bag.   Place a towel between your skin and the bag.   Leave the ice on for 15 to 20 minutes, 3 to 4 times a day.   Only take over-the-counter or prescription medicines for pain, discomfort, or fever as directed by your caregiver. Your caregiver may recommend avoiding anti-inflammatory medicines (aspirin, ibuprofen, and naproxen) for 48 hours because these medicines may increase bruising.   Rest the injured area.   If possible, elevate the injured area to reduce swelling.  SEEK IMMEDIATE MEDICAL CARE IF:   You have increased bruising or swelling.   You have pain that is getting worse.   Your swelling or pain is not relieved with medicines.  MAKE SURE YOU:   Understand these instructions.   Will watch your condition.   Will get help right away if you are not doing well or get worse.  Document  Released: 08/20/2005 Document Revised: 10/30/2011 Document Reviewed: 09/15/2011 [employer] Patient Information 2012 [employer]. 

## 2014-01-09 ENCOUNTER — Emergency Department (HOSPITAL_COMMUNITY): Payer: PRIVATE HEALTH INSURANCE

## 2014-01-09 ENCOUNTER — Emergency Department (HOSPITAL_COMMUNITY)
Admission: EM | Admit: 2014-01-09 | Discharge: 2014-01-09 | Disposition: A | Discharge status: Home / Self Care | Payer: Self-pay | Attending: Emergency Medicine | Admitting: Emergency Medicine

## 2014-01-09 ENCOUNTER — Encounter (HOSPITAL_COMMUNITY): Payer: Self-pay | Admitting: Emergency Medicine

## 2014-01-09 DIAGNOSIS — K529 Noninfective gastroenteritis and colitis, unspecified: Secondary | ICD-10-CM

## 2014-01-09 DIAGNOSIS — Z9889 Other specified postprocedural states: Secondary | ICD-10-CM | POA: Insufficient documentation

## 2014-01-09 DIAGNOSIS — Z87442 Personal history of urinary calculi: Secondary | ICD-10-CM | POA: Insufficient documentation

## 2014-01-09 DIAGNOSIS — Z3202 Encounter for pregnancy test, result negative: Secondary | ICD-10-CM | POA: Insufficient documentation

## 2014-01-09 DIAGNOSIS — Z87828 Personal history of other (healed) physical injury and trauma: Secondary | ICD-10-CM | POA: Insufficient documentation

## 2014-01-09 DIAGNOSIS — K5289 Other specified noninfective gastroenteritis and colitis: Secondary | ICD-10-CM | POA: Insufficient documentation

## 2014-01-09 HISTORY — DX: Accidental discharge from unspecified firearms or gun, initial encounter: W34.00XA

## 2014-01-09 HISTORY — DX: Calculus of kidney: N20.0

## 2014-01-09 LAB — CBC WITH DIFFERENTIAL/PLATELET
Basophils Absolute: 0 10*3/uL (ref 0.0–0.1)
Basophils Relative: 0 % (ref 0–1)
EOS PCT: 0 % (ref 0–5)
Eosinophils Absolute: 0 10*3/uL (ref 0.0–0.7)
HCT: 36.6 % (ref 36.0–46.0)
Hemoglobin: 12.5 g/dL (ref 12.0–15.0)
LYMPHS ABS: 0.4 10*3/uL — AB (ref 0.7–4.0)
LYMPHS PCT: 5 % — AB (ref 12–46)
MCH: 30.6 pg (ref 26.0–34.0)
MCHC: 34.2 g/dL (ref 30.0–36.0)
MCV: 89.7 fL (ref 78.0–100.0)
Monocytes Absolute: 0.4 10*3/uL (ref 0.1–1.0)
Monocytes Relative: 4 % (ref 3–12)
NEUTROS ABS: 8 10*3/uL — AB (ref 1.7–7.7)
Neutrophils Relative %: 90 % — ABNORMAL HIGH (ref 43–77)
PLATELETS: 275 10*3/uL (ref 150–400)
RBC: 4.08 MIL/uL (ref 3.87–5.11)
RDW: 13.4 % (ref 11.5–15.5)
WBC: 8.9 10*3/uL (ref 4.0–10.5)

## 2014-01-09 LAB — COMPREHENSIVE METABOLIC PANEL
ALBUMIN: 4 g/dL (ref 3.5–5.2)
ALT: 15 U/L (ref 0–35)
AST: 17 U/L (ref 0–37)
Alkaline Phosphatase: 46 U/L (ref 39–117)
BUN: 9 mg/dL (ref 6–23)
CALCIUM: 9.6 mg/dL (ref 8.4–10.5)
CHLORIDE: 102 meq/L (ref 96–112)
CO2: 25 meq/L (ref 19–32)
Creatinine, Ser: 0.77 mg/dL (ref 0.50–1.10)
GFR calc Af Amer: >90 mL/min (ref >90)
GFR calc non Af Amer: >90 mL/min (ref >90)
GLUCOSE: 94 mg/dL (ref 70–99)
POTASSIUM: 3.8 meq/L (ref 3.7–5.3)
SODIUM: 141 meq/L (ref 137–147)
Total Bilirubin: 1.1 mg/dL (ref 0.3–1.2)
Total Protein: 8.4 g/dL — ABNORMAL HIGH (ref 6.0–8.3)

## 2014-01-09 LAB — URINALYSIS, ROUTINE W REFLEX MICROSCOPIC
BILIRUBIN URINE: NEGATIVE
Glucose, UA: NEGATIVE mg/dL
Hgb urine dipstick: NEGATIVE
KETONES UR: 15 mg/dL — AB
LEUKOCYTES UA: NEGATIVE
NITRITE: NEGATIVE
Protein, ur: NEGATIVE mg/dL
SPECIFIC GRAVITY, URINE: 1.026 (ref 1.005–1.030)
UROBILINOGEN UA: 1 mg/dL (ref 0.0–1.0)
pH: 8 (ref 5.0–8.0)

## 2014-01-09 LAB — POCT I-STAT TROPONIN I: Troponin i, poc: 0 ng/mL (ref 0.00–0.08)

## 2014-01-09 LAB — POCT PREGNANCY, URINE: Preg Test, Ur: NEGATIVE

## 2014-01-09 LAB — LIPASE, BLOOD: LIPASE: 12 U/L (ref 11–59)

## 2014-01-09 MED ORDER — HYDROMORPHONE HCL PF 1 MG/ML IJ SOLN
1.0000 mg | Freq: Once | INTRAMUSCULAR | Status: AC
  Administered 2014-01-09: 1 mg via INTRAVENOUS
  Filled 2014-01-09: qty 1

## 2014-01-09 MED ORDER — IOHEXOL 300 MG/ML  SOLN: 20.0000 mL | INTRAMUSCULAR | Status: AC

## 2014-01-09 MED ORDER — SODIUM CHLORIDE 0.9 % IV BOLUS (SEPSIS)
1000.0000 mL | Freq: Once | INTRAVENOUS | Status: AC
  Administered 2014-01-09: 1000 mL via INTRAVENOUS

## 2014-01-09 MED ORDER — IOHEXOL 300 MG/ML  SOLN
100.0000 mL | Freq: Once | INTRAMUSCULAR | Status: AC | PRN
  Administered 2014-01-09: 100 mL via INTRAVENOUS

## 2014-01-09 MED ORDER — HYDROCODONE-ACETAMINOPHEN 5-325 MG PO TABS: 1.0000 | ORAL_TABLET | ORAL | Status: DC | PRN

## 2014-01-09 MED ORDER — PROMETHAZINE HCL 25 MG/ML IJ SOLN
25.0000 mg | Freq: Once | INTRAMUSCULAR | Status: AC
  Administered 2014-01-09: 25 mg via INTRAVENOUS
  Filled 2014-01-09: qty 1

## 2014-01-09 MED ORDER — ONDANSETRON HCL 4 MG/2ML IJ SOLN
4.0000 mg | Freq: Once | INTRAMUSCULAR | Status: AC
  Administered 2014-01-09: 4 mg via INTRAVENOUS
  Filled 2014-01-09: qty 2

## 2014-01-09 MED ORDER — PROMETHAZINE HCL 25 MG PO TABS: 25.0000 mg | ORAL_TABLET | Freq: Four times a day (QID) | ORAL | Status: DC | PRN

## 2014-01-09 NOTE — Discharge Instructions (Signed)
Viral Gastroenteritis Viral gastroenteritis is also known as stomach flu. This condition affects the stomach and intestinal tract. It can cause sudden diarrhea and vomiting. The illness typically lasts 3 to 8 days. Most people develop an immune response that eventually gets rid of the virus. While this natural response develops, the virus can make you quite ill. CAUSES  Many different viruses can cause gastroenteritis, such as rotavirus or noroviruses. You can catch one of these viruses by consuming contaminated food or water. You may also catch a virus by sharing utensils or other personal items with an infected person or by touching a contaminated surface. SYMPTOMS  The most common symptoms are diarrhea and vomiting. These problems can cause a severe loss of body fluids (dehydration) and a body salt (electrolyte) imbalance. Other symptoms may include:  Fever.  Headache.  Fatigue.  Abdominal pain. DIAGNOSIS  Your caregiver can usually diagnose viral gastroenteritis based on your symptoms and a physical exam. A stool sample may also be taken to test for the presence of viruses or other infections. TREATMENT  This illness typically goes away on its own. Treatments are aimed at rehydration. The most serious cases of viral gastroenteritis involve vomiting so severely that you are not able to keep fluids down. In these cases, fluids must be given through an intravenous line (IV). HOME CARE INSTRUCTIONS   Drink enough fluids to keep your urine clear or pale yellow. Drink small amounts of fluids frequently and increase the amounts as tolerated.  Ask your caregiver for specific rehydration instructions.  Avoid:  Foods high in sugar.  Alcohol.  Carbonated drinks.  Tobacco.  Juice.  Caffeine drinks.  Extremely hot or cold fluids.  Fatty, greasy foods.  Too much intake of anything at one time.  Dairy products until 24 to 48 hours after diarrhea stops.  You may consume probiotics.  Probiotics are active cultures of beneficial bacteria. They may lessen the amount and number of diarrheal stools in adults. Probiotics can be found in yogurt with active cultures and in supplements.  Wash your hands well to avoid spreading the virus.  Only take over-the-counter or prescription medicines for pain, discomfort, or fever as directed by your caregiver. Do not give aspirin to children. Antidiarrheal medicines are not recommended.  Ask your caregiver if you should continue to take your regular prescribed and over-the-counter medicines.  Keep all follow-up appointments as directed by your caregiver. SEEK IMMEDIATE MEDICAL CARE IF:   You are unable to keep fluids down.  You do not urinate at least once every 6 to 8 hours.  You develop shortness of breath.  You notice blood in your stool or vomit. This may look like coffee grounds.  You have abdominal pain that increases or is concentrated in one small area (localized).  You have persistent vomiting or diarrhea.  You have a fever.  The patient is a child younger than 3 months, and he or she has a fever.  The patient is a child older than 3 months, and he or she has a fever and persistent symptoms.  The patient is a child older than 3 months, and he or she has a fever and symptoms suddenly get worse.  The patient is a baby, and he or she has no tears when crying. MAKE SURE YOU:   Understand these instructions.  Will watch your condition.  Will get help right away if you are not doing well or get worse. Document Released: 11/10/2005 Document Revised: 02/02/2012 Document Reviewed: 08/27/2011   [employer] Patient Information 2014 [employer].  

## 2014-01-09 NOTE — ED Notes (Signed)
PA at bedside.
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
## 2014-01-09 NOTE — ED Notes (Signed)
CT made aware that pt finished oral contrast  

## 2014-01-09 NOTE — ED Notes (Signed)
Pt reports increased nausea since CT
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
## 2014-01-10 NOTE — ED Provider Notes (Signed)
Medical screening examination/treatment/procedure(s) were performed by non-physician practitioner and as supervising physician I was immediately available for consultation/collaboration.  EKG Interpretation    Date/Time:  Monday January 09 2014 20:20:41 EST Ventricular Rate:  69 PR Interval:  158 QRS Duration: 78 QT Interval:  421 QTC Calculation: 451 R Axis:   78 Text Interpretation:  Age not entered, assumed to be  38 years old for purpose of ECG interpretation Sinus rhythm RSR' in V1 or V2, right VCD or RVH No previous ECGs available Confirmed by [facility]  MD, Vylet Maffia [phone]) on 01/09/2014 8:45:56 PM             Babette Relic, MD 01/10/14 2140

## 2014-10-01 ENCOUNTER — Emergency Department (HOSPITAL_COMMUNITY): Payer: PRIVATE HEALTH INSURANCE

## 2014-10-01 ENCOUNTER — Encounter (HOSPITAL_COMMUNITY): Payer: Self-pay

## 2014-10-01 ENCOUNTER — Emergency Department (HOSPITAL_COMMUNITY)
Admission: EM | Admit: 2014-10-01 | Discharge: 2014-10-01 | Disposition: A | Discharge status: Home / Self Care | Payer: Self-pay | Attending: Emergency Medicine | Admitting: Emergency Medicine

## 2014-10-01 DIAGNOSIS — R519 Headache, unspecified: Secondary | ICD-10-CM

## 2014-10-01 DIAGNOSIS — Z8679 Personal history of other diseases of the circulatory system: Secondary | ICD-10-CM | POA: Insufficient documentation

## 2014-10-01 DIAGNOSIS — R51 Headache: Secondary | ICD-10-CM | POA: Insufficient documentation

## 2014-10-01 DIAGNOSIS — R42 Dizziness and giddiness: Secondary | ICD-10-CM | POA: Insufficient documentation

## 2014-10-01 DIAGNOSIS — Z87442 Personal history of urinary calculi: Secondary | ICD-10-CM | POA: Insufficient documentation

## 2014-10-01 DIAGNOSIS — Z87828 Personal history of other (healed) physical injury and trauma: Secondary | ICD-10-CM | POA: Insufficient documentation

## 2014-10-01 LAB — CBC
HEMATOCRIT: 37.1 % (ref 36.0–46.0)
HEMOGLOBIN: 12.2 g/dL (ref 12.0–15.0)
MCH: 28.2 pg (ref 26.0–34.0)
MCHC: 32.9 g/dL (ref 30.0–36.0)
MCV: 85.7 fL (ref 78.0–100.0)
Platelets: 275 10*3/uL (ref 150–400)
RBC: 4.33 MIL/uL (ref 3.87–5.11)
RDW: 13.7 % (ref 11.5–15.5)
WBC: 6.3 10*3/uL (ref 4.0–10.5)

## 2014-10-01 LAB — BASIC METABOLIC PANEL
Anion gap: 13 (ref 5–15)
BUN: 9 mg/dL (ref 6–23)
CALCIUM: 9.7 mg/dL (ref 8.4–10.5)
CO2: 27 mEq/L (ref 19–32)
CREATININE: 0.73 mg/dL (ref 0.50–1.10)
Chloride: 102 mEq/L (ref 96–112)
GFR calc non Af Amer: >90 mL/min (ref >90)
GLUCOSE: 96 mg/dL (ref 70–99)
POTASSIUM: 3.9 meq/L (ref 3.7–5.3)
Sodium: 142 mEq/L (ref 137–147)

## 2014-10-01 LAB — I-STAT TROPONIN, ED: Troponin i, poc: 0.01 ng/mL (ref 0.00–0.08)

## 2014-10-01 MED ORDER — HYDROMORPHONE HCL 1 MG/ML IJ SOLN
1.0000 mg | Freq: Once | INTRAMUSCULAR | Status: AC
  Administered 2014-10-01: 1 mg via INTRAMUSCULAR
  Filled 2014-10-01: qty 1

## 2014-10-01 MED ORDER — DIPHENHYDRAMINE HCL 25 MG PO CAPS
25.0000 mg | ORAL_CAPSULE | Freq: Once | ORAL | Status: AC
  Administered 2014-10-01: 25 mg via ORAL
  Filled 2014-10-01: qty 1

## 2014-10-01 MED ORDER — METOCLOPRAMIDE HCL 10 MG PO TABS
10.0000 mg | ORAL_TABLET | Freq: Once | ORAL | Status: AC
  Administered 2014-10-01: 10 mg via ORAL
  Filled 2014-10-01: qty 1

## 2014-10-01 MED ORDER — SUMATRIPTAN SUCCINATE 25 MG PO TABS
25.0000 mg | ORAL_TABLET | Freq: Once | ORAL | Status: AC
  Administered 2014-10-01: 25 mg via ORAL
  Filled 2014-10-01: qty 1

## 2014-10-01 MED ORDER — DEXAMETHASONE SODIUM PHOSPHATE 10 MG/ML IJ SOLN
10.0000 mg | Freq: Once | INTRAMUSCULAR | Status: AC
  Administered 2014-10-01: 10 mg via INTRAMUSCULAR
  Filled 2014-10-01: qty 1

## 2014-10-01 NOTE — Discharge Instructions (Signed)
Follow up with your primary care doctor.  Migraine Headache A migraine headache is an intense, throbbing pain on one or both sides of your head. A migraine can last for 30 minutes to several hours. CAUSES  The exact cause of a migraine headache is not always known. However, a migraine may be caused when nerves in the brain become irritated and release chemicals that cause inflammation. This causes pain. Certain things may also trigger migraines, such as:  Alcohol.  Smoking.  Stress.  Menstruation.  Aged cheeses.  Foods or drinks that contain nitrates, glutamate, aspartame, or tyramine.  Lack of sleep.  Chocolate.  Caffeine.  Hunger.  Physical exertion.  Fatigue.  Medicines used to treat chest pain (nitroglycerine), birth control pills, estrogen, and some blood pressure medicines. SIGNS AND SYMPTOMS  Pain on one or both sides of your head.  Pulsating or throbbing pain.  Severe pain that prevents daily activities.  Pain that is aggravated by any physical activity.  Nausea, vomiting, or both.  Dizziness.  Pain with exposure to bright lights, loud noises, or activity.  General sensitivity to bright lights, loud noises, or smells. Before you get a migraine, you may get warning signs that a migraine is coming (aura). An aura may include:  Seeing flashing lights.  Seeing bright spots, halos, or zigzag lines.  Having tunnel vision or blurred vision.  Having feelings of numbness or tingling.  Having trouble talking.  Having muscle weakness. DIAGNOSIS  A migraine headache is often diagnosed based on:  Symptoms.  Physical exam.  A CT scan or MRI of your head. These imaging tests cannot diagnose migraines, but they can help rule out other causes of headaches. TREATMENT Medicines may be given for pain and nausea. Medicines can also be given to help prevent recurrent migraines.  HOME CARE INSTRUCTIONS  Only take over-the-counter or prescription medicines  for pain or discomfort as directed by your health care provider. The use of long-term narcotics is not recommended.  Lie down in a dark, quiet room when you have a migraine.  Keep a journal to find out what may trigger your migraine headaches. For example, write down:  What you eat and drink.  How much sleep you get.  Any change to your diet or medicines.  Limit alcohol consumption.  Quit smoking if you smoke.  Get 7-9 hours of sleep, or as recommended by your health care provider.  Limit stress.  Keep lights dim if bright lights bother you and make your migraines worse. SEEK IMMEDIATE MEDICAL CARE IF:   Your migraine becomes severe.  You have a fever.  You have a stiff neck.  You have vision loss.  You have muscular weakness or loss of muscle control.  You start losing your balance or have trouble walking.  You feel faint or pass out.  You have severe symptoms that are different from your first symptoms. MAKE SURE YOU:   Understand these instructions.  Will watch your condition.  Will get help right away if you are not doing well or get worse. Document Released: 11/10/2005 Document Revised: 03/27/2014 Document Reviewed: 07/18/2013 [facility] Patient Information 2015 [place], [place]. This information is not intended to replace advice given to you by your health care provider. Make sure you discuss any questions you have with your health care provider. Cluster Headache Cluster headaches are recognized by their pattern of deep, intense head pain. They normally occur on one side of your head, but they may "switch sides" in subsequent episodes. Typically,  cluster headaches:   Are severe in nature.   Occur repeatedly over weeks to months and are followed by periods of no headaches.   Can last from 15 minutes to 3 hours.   Occur at the same time each day, often at night.   Occur several times a day. CAUSES The exact cause of cluster headaches is not known.  Alcohol use may be associated with cluster headaches. SIGNS AND SYMPTOMS   Severe pain that begins in or around your eye or temple.   One-sided head pain.   Feeling sick to your stomach (nauseous).   Sensitivity to light.   Runny nose.   Eye redness, tearing, and nasal stuffiness on the side of your head where you are experiencing pain.   Sweaty, pale skin of the face.   Droopy or swollen eyelid.   Restlessness. DIAGNOSIS  Cluster headaches are diagnosed based on symptoms and a physical exam. Your health care provider may order a CT scan or an MRI of your head or lab tests to see if your headaches are caused by other medical conditions.  TREATMENT   Medicines for pain relief and to prevent recurrent attacks. Some people may need a combination of medicines.  Oxygen for pain relief.   Biofeedback programs to help reduce headache pain.  It may be helpful to keep a headache diary. This may help you find a trend for what is triggering your headaches. Your health care provider can develop a treatment plan.  HOME CARE INSTRUCTIONS  During cluster periods:   Follow a regular sleep schedule. Do not vary the amount and time that you sleep from day to day. It is important to stay on the same schedule during a cluster period to help prevent headaches.   Avoid alcohol.   Stop smoking if you smoke.  SEEK MEDICAL CARE IF:  You have any changes from your previous cluster headaches either in intensity or frequency.   You are not getting relief from medicines you are taking.  SEEK IMMEDIATE MEDICAL CARE IF:   You faint.   You have weakness or numbness, especially on one side of your body or face.   You have double vision.   You have nausea or vomiting that is not relieved within several hours.   You cannot keep your balance or have difficulty talking or walking.   You have neck pain or stiffness.   You have a fever. MAKE SURE YOU:  Understand these  instructions.   Will watch your condition.   Will get help right away if you are not doing well or get worse. Document Released: 11/10/2005 Document Revised: 08/31/2013 Document Reviewed: 06/02/2013 [facility] Patient Information 2015 [place], [place]. This information is not intended to replace advice given to you by your health care provider. Make sure you discuss any questions you have with your health care provider.
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
## 2014-10-01 NOTE — ED Notes (Signed)
PTAR- Pt coming from church, sudden onset of dizziness and hot flash. Pt denies LOC. Sitting did not improve spell. Pt c/o nausea and headache on EMS arrival. BP elevated at 180/110. No medical history but has had migraines in the past years ago. Pain mostly above right eye. CBG: 102, HR 70.

## 2014-10-09 ENCOUNTER — Emergency Department (HOSPITAL_COMMUNITY): Payer: PRIVATE HEALTH INSURANCE

## 2014-10-09 ENCOUNTER — Emergency Department (HOSPITAL_COMMUNITY)
Admission: EM | Admit: 2014-10-09 | Discharge: 2014-10-09 | Disposition: A | Discharge status: Home / Self Care | Payer: Self-pay | Attending: Emergency Medicine | Admitting: Emergency Medicine

## 2014-10-09 ENCOUNTER — Encounter (HOSPITAL_COMMUNITY): Payer: Self-pay | Admitting: Emergency Medicine

## 2014-10-09 DIAGNOSIS — M7918 Myalgia, other site: Secondary | ICD-10-CM

## 2014-10-09 DIAGNOSIS — Z3202 Encounter for pregnancy test, result negative: Secondary | ICD-10-CM | POA: Insufficient documentation

## 2014-10-09 DIAGNOSIS — R52 Pain, unspecified: Secondary | ICD-10-CM

## 2014-10-09 DIAGNOSIS — Z9889 Other specified postprocedural states: Secondary | ICD-10-CM | POA: Insufficient documentation

## 2014-10-09 DIAGNOSIS — R350 Frequency of micturition: Secondary | ICD-10-CM | POA: Insufficient documentation

## 2014-10-09 DIAGNOSIS — Z87828 Personal history of other (healed) physical injury and trauma: Secondary | ICD-10-CM | POA: Insufficient documentation

## 2014-10-09 DIAGNOSIS — Z87442 Personal history of urinary calculi: Secondary | ICD-10-CM | POA: Insufficient documentation

## 2014-10-09 DIAGNOSIS — M791 Myalgia: Secondary | ICD-10-CM | POA: Insufficient documentation

## 2014-10-09 LAB — I-STAT CHEM 8, ED
BUN: 14 mg/dL (ref 6–23)
CALCIUM ION: 1.26 mmol/L — AB (ref 1.12–1.23)
CREATININE: 0.8 mg/dL (ref 0.50–1.10)
Chloride: 102 mEq/L (ref 96–112)
GLUCOSE: 99 mg/dL (ref 70–99)
HEMATOCRIT: 40 % (ref 36.0–46.0)
Hemoglobin: 13.6 g/dL (ref 12.0–15.0)
POTASSIUM: 4.3 meq/L (ref 3.7–5.3)
Sodium: 139 mEq/L (ref 137–147)
TCO2: 26 mmol/L (ref 0–100)

## 2014-10-09 LAB — URINALYSIS, ROUTINE W REFLEX MICROSCOPIC
Bilirubin Urine: NEGATIVE
Glucose, UA: NEGATIVE mg/dL
HGB URINE DIPSTICK: NEGATIVE
KETONES UR: NEGATIVE mg/dL
Leukocytes, UA: NEGATIVE
NITRITE: NEGATIVE
PROTEIN: NEGATIVE mg/dL
SPECIFIC GRAVITY, URINE: 1.019 (ref 1.005–1.030)
UROBILINOGEN UA: 0.2 mg/dL (ref 0.0–1.0)
pH: 5 (ref 5.0–8.0)

## 2014-10-09 LAB — CBC WITH DIFFERENTIAL/PLATELET
BASOS PCT: 0 % (ref 0–1)
Basophils Absolute: 0 10*3/uL (ref 0.0–0.1)
EOS ABS: 0.2 10*3/uL (ref 0.0–0.7)
EOS PCT: 2 % (ref 0–5)
HCT: 36.5 % (ref 36.0–46.0)
Hemoglobin: 12 g/dL (ref 12.0–15.0)
Lymphocytes Relative: 37 % (ref 12–46)
Lymphs Abs: 3 10*3/uL (ref 0.7–4.0)
MCH: 28.9 pg (ref 26.0–34.0)
MCHC: 32.9 g/dL (ref 30.0–36.0)
MCV: 88 fL (ref 78.0–100.0)
Monocytes Absolute: 0.7 10*3/uL (ref 0.1–1.0)
Monocytes Relative: 8 % (ref 3–12)
NEUTROS PCT: 53 % (ref 43–77)
Neutro Abs: 4.1 10*3/uL (ref 1.7–7.7)
PLATELETS: 243 10*3/uL (ref 150–400)
RBC: 4.15 MIL/uL (ref 3.87–5.11)
RDW: 14.5 % (ref 11.5–15.5)
WBC: 7.9 10*3/uL (ref 4.0–10.5)

## 2014-10-09 LAB — COMPREHENSIVE METABOLIC PANEL
ALT: 17 U/L (ref 0–35)
ANION GAP: 13 (ref 5–15)
AST: 18 U/L (ref 0–37)
Albumin: 3.4 g/dL — ABNORMAL LOW (ref 3.5–5.2)
Alkaline Phosphatase: 49 U/L (ref 39–117)
BUN: 13 mg/dL (ref 6–23)
CALCIUM: 9.3 mg/dL (ref 8.4–10.5)
CO2: 23 meq/L (ref 19–32)
CREATININE: 0.79 mg/dL (ref 0.50–1.10)
Chloride: 101 mEq/L (ref 96–112)
GLUCOSE: 96 mg/dL (ref 70–99)
Potassium: 4.5 mEq/L (ref 3.7–5.3)
SODIUM: 137 meq/L (ref 137–147)
Total Bilirubin: 0.4 mg/dL (ref 0.3–1.2)
Total Protein: 7.5 g/dL (ref 6.0–8.3)

## 2014-10-09 LAB — I-STAT TROPONIN, ED: Troponin i, poc: 0.01 ng/mL (ref 0.00–0.08)

## 2014-10-09 LAB — POC URINE PREG, ED: Preg Test, Ur: NEGATIVE

## 2014-10-09 LAB — LIPASE, BLOOD: LIPASE: 28 U/L (ref 11–59)

## 2014-10-09 MED ORDER — METHOCARBAMOL 500 MG PO TABS: 500.0000 mg | ORAL_TABLET | Freq: Two times a day (BID) | ORAL | Status: DC

## 2014-10-09 MED ORDER — ONDANSETRON HCL 4 MG/2ML IJ SOLN
4.0000 mg | Freq: Once | INTRAMUSCULAR | Status: AC
  Administered 2014-10-09: 4 mg via INTRAVENOUS
  Filled 2014-10-09: qty 2

## 2014-10-09 MED ORDER — FENTANYL CITRATE 0.05 MG/ML IJ SOLN
50.0000 ug | INTRAMUSCULAR | Status: AC
  Administered 2014-10-09: 50 ug via INTRAVENOUS
  Filled 2014-10-09: qty 2

## 2014-10-09 MED ORDER — SODIUM CHLORIDE 0.9 % IV SOLN
1000.0000 mL | Freq: Once | INTRAVENOUS | Status: AC
  Administered 2014-10-09: 1000 mL via INTRAVENOUS
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
## 2014-10-09 NOTE — ED Notes (Signed)
Assumed care of pt for discharge

## 2014-10-09 NOTE — Discharge Instructions (Signed)
°Emergency Department Resource Guide °1) Find a Doctor and Pay Out of Pocket °Although you won't have to find out who is covered by your insurance plan, it is a good idea to ask around and get recommendations. You will then need to call the office and see if the doctor you have chosen will accept you as a new patient and what types of options they offer for patients who are self-pay. Some doctors offer discounts or will set up payment plans for their patients who do not have insurance, but you will need to ask so you aren't surprised when you get to your appointment. ° °2) Contact Your Local Health Department °Not all health departments have doctors that can see patients for sick visits, but many do, so it is worth a call to see if yours does. If you don't know where your local health department is, you can check in your phone book. The CDC also has a tool to help you locate your state's health department, and many state websites also have listings of all of their local health departments. ° °3) Find a Walk-in Clinic °If your illness is not likely to be very severe or complicated, you may want to try a walk in clinic. These are popping up all over the country in pharmacies, drugstores, and shopping centers. They're usually staffed by nurse practitioners or physician assistants that have been trained to treat common illnesses and complaints. They're usually fairly quick and inexpensive. However, if you have serious medical issues or chronic medical problems, these are probably not your best option. ° °No Primary Care Doctor: °- Call Health Connect at  832-8000 - they can help you locate a primary care doctor that  accepts your insurance, provides certain services, etc. °- Physician Referral Service- 1-800-533-3463 ° °Chronic Pain Problems: °Organization         Address  Phone   Notes  °[facility]  (336) 297-2271 Patients need to be referred by their primary care doctor.  ° °Medication  Assistance: °Organization         Address  Phone   Notes  °[place] [facility] [address] °[place], [place] [address] (336) 641-8030 --Must be a resident of [place] °-- Must have NO insurance coverage whatsoever (no Medicaid/ Medicare, etc.) °-- The pt. MUST have a primary care doctor that directs their care regularly and follows them in the community °  °[facility]  (866) 331-1348   °[employer]  (888) 892-1162   ° °Agencies that provide inexpensive medical care: °Organization         Address  Phone   Notes  °[facility]  (336) 832-8035   °[facility]    (336) 832-7272   °[facility] [address] °[place], [place] [address] (336) 832-4777   °[facility] [address], °[place] (336) 271-4999   °[facility]    (336) 373-0678   °[facility]    (336) 272-1050   °[facility] ° [address], [place] Phone:  (336) 832-4444, Fax:  (336) 832-4440 Hours of Operation:  9 am - 6 pm, M-F.  Also accepts Medicaid/Medicare and self-pay.  °[facility] ° [address], [place] Phone: (336) 832-3150, Fax: (336) [phone]. Hours of Operation:  8:30 am - 5:30 pm, M-F.  Also accepts Medicaid and self-pay.  °[facility] [place] [address], [place] Phone: (336) 878-6027   °[facility] [address], [place], [place] (336)723-1848, Ext. 123 Mondays & Thursdays: 7-9 AM.  First 15 patients are seen on a first come, first serve basis. °  ° °Medicaid-accepting [place] Providers: ° °Organization         Address  Phone   Notes  °[facility] [address], [place] (336) 641-2100 Also accepts self-pay patients.  °[facility] [address], [place] ° (336) 856-9996   °[facility] [address], [place]  (336) 288-8857   °[facility] [address] [place] [address], [place] (336) 299-7000   °Veita Bland [address], [place]  ° (336) 373-1557 Only accepts [place] Access Medicaid patients after they have their name applied to their card.  ° °Self-Pay (no insurance) in [place]: ° °Organization         Address  Phone   Notes  °Sickle Cell Patients, [facility] [address], [place] (336) 832-1970   °[facility] [address], [place] (336) 832-4400   °[facility] ° [address], [place] (336) 992-4800   °[facility]/Dr. Osei-Bonsu ° [address] [place] [address], [place] or [address], [place] (336) [phone] Phone number for both [place] and [place] locations is the same.  °[facility] [address], [place] (336) 299-0000   °[facility] [address] [place] [address], [place] or [address] (336) 852-7530 °(336) 878-2260   °[facility] [address], [place] (336) 350-1642, phone; (336) 294-5005, fax Sees patients 1st and 3rd Saturday of every month.  Must not qualify for public or private insurance (i.e. Medicaid, Medicare, [place] Health Choice, Veterans' Benefits) • Household income should be no more than 200% of the poverty level •The clinic cannot treat you if you are pregnant or think you are pregnant • Sexually transmitted diseases are not treated at the clinic.  ° ° °Dental Care: °Organization         Address  Phone  Notes  °[place] [facility] [address], [place] (336) 641-6152 Accepts children up to age 21 who are enrolled in Medicaid or [place] Health Choice; pregnant women with a Medicaid card; and children who have applied for Medicaid or [place] Health Choice, but were declined, whose parents can pay a reduced fee at time of service.  °[place]  [facility] [place]  [address], [place] (336) 641-7733 Accepts children up to age 21 who are enrolled in Medicaid or [place] Health Choice; pregnant women with a Medicaid card; and children who have applied for Medicaid or [place] Health Choice, but were declined, whose parents can pay a reduced fee at time of service.  °[facility] ° [address], [place] (336) 641-4533 Patients are seen by appointment only. Walk-ins are not accepted. [facility] will see patients 18 years of age and older. °Monday - Tuesday (8am-5pm) °Most Wednesdays (8:30-5pm) °$30 per visit, cash only  °[facility] ° [address], [place] (336) 641-4533 Patients are seen by appointment only. Walk-ins are not accepted. [facility] will see patients 18 years of age and older. °One   Wednesday Evening (Monthly: Volunteer Based).  $30 per visit, cash only  °[facility]  (919) 537-3737 for adults; Children under age 4, call Graduate Pediatric Dentistry at (919) [phone]. Children aged 4-14, please call (919) [phone] to request a pediatric application. ° Dental services are provided in all areas of dental care including fillings, crowns and bridges, complete and partial dentures, implants, gum treatment, root canals, and extractions. Preventive care is also provided. Treatment is provided to both adults and children. °Patients are selected via a lottery and there is often a waiting list. °  °[facility] [address], °[place] ° (336) 763-8833 [email or web address] °  °[facility] [address], [place], [place] (336)723-1848, Ext. 123 Second and Fourth Thursday of each month, opens at 6:30 AM; Clinic ends at 9 AM.  Patients are seen on a first-come first-served basis, and a limited number are seen during each clinic.  ° °[facility] ° [address], [place], [place] (336) 723-7904    Eligibility Requirements °You must have lived in [place], [place], or [place] counties for at least the last three months. °  You cannot be eligible for state or federal sponsored healthcare insurance, including Veterans Administration, Medicaid, or Medicare. °  You generally cannot be eligible for healthcare insurance through your employer.  °  How to apply: °Eligibility screenings are held every Tuesday and Wednesday afternoon from 1:00 pm until 4:00 pm. You do not need an appointment for the interview!  °[facility] [address], [place], [place] 336-631-2330   °[facility]  336-342-8273   °[facility]  336-703-3100   °[facility]  336-570-6415   ° °Behavioral Health Resources in the Community: °Intensive Outpatient Programs °Organization         Address  Phone  Notes  °[facility] [address], [place], [place] 336-878-6098   °[facility] [address], [place], [place] 336-832-9800   °[facility] [address], [place], [place] ° 336-882-2125   °[facility] [address],  °[place], [place] 1-800-853-5163 or 336-641-4981   °Substance Abuse Resources °Organization         Address  Phone  Notes  °[facility]  336-882-2125   °[facility]  336-784-9470   °[facility]  336-285-9073   °[facility]  336-845-3988   °Residential & Outpatient Substance Abuse Program  1-800-659-3381   °Psychological Services °Organization         Address  Phone  Notes  °[facility]  336- 832-9600   °[facility]  336- 378-7881   °[facility] [address], [place] 1-800-853-5163 or 336-641-4981   ° °Mobile Crisis Teams °Organization         Address  Phone  Notes  °[facility], Mobile Crisis Care Unit  1-877-626-1772   °Assertive °[facility] ° [address]  [place], [place] 336-834-9664   °Sharon DeEsch [address] °[place] 336-554-5454   ° °Self-Help/Support Groups °Organization         Address  Phone             Notes  °[facility] [place] - variety of support groups  336- 373-1402 Call for more information  °Narcotics Anonymous (NA), [facility] [address], °[place]  2 meetings at this location  ° °  Residential Treatment Programs °Organization         Address  Phone  Notes  °[facility] [address],    °[place]  1-866-801-8205   °[facility] ° [address], [place], [place] 704-293-8524   °[facility] [address], [place] 336-845-3988 Admissions: 8am-3pm M-F  °[facility] [address]    °[place], [place] 336-841-1104   °[facility] [address], [place], [place] 336-379-7146   °[facility] [address]  °[place], [place] 336-285-9073   °[facility] Intensive Outpatient [address], [place], [place] 336-852-3033   °[facility] [address]  °[place], [place] 1-877-615-2722 or 336-784-9470   °[facility] [address] [place], [place] 336-227-7417 Accepts Medicaid  °[facility] [address]  °[place] 1-800-659-3381 Substance Abuse/Addiction Treatment  ° °[place] Behavioral Health Resources °Organization         Address  Phone  Notes  °[facility]  (888) 581-9988   °Julie Brannon, PhD [address] [place], [place]   (336) 349-5553 or (336) 951-0000   °[facility]   [address] °[place], [place] (336) 349-4454   °[facility] [address], [place], [place] (336) 342-8316 Insurance/Medicaid/sponsorship through [facility]  °[facility] [address]                                    [place], [place] (336) 342-8316 Therapy/tele-psych/case    °[facility] [address]  ° [place], [place] (336) 349-2233    °Dr. Arfeen  (336) 349-4544   °[facility] [place]  [employer] [place] [facility] 1) [address], [place] °2) [address], [place] °3)  [address], [place] (336) 349-3220 °(336) 342-7768 ° °(336) 342-8140   °[place] [facility] (336) 342-1394 or (336) 342-3537 (After Hours)    ° ° °

## 2014-10-09 NOTE — ED Notes (Addendum)
Patient with left flank pain, radiates to her lower back and lower abdomen.  No vaginal discharge.  No pain with urination but she is having some frequency.  Patient is nauseated.

## 2014-10-09 NOTE — ED Notes (Signed)
MD at bedside. 

## 2014-10-09 NOTE — ED Notes (Signed)
Pt reports waking up an hour and half ago with left flank pain radiating to lower back and across abdomen. Having nausea with the pain, though no vomiting or diarrhea. Denies dysuria, is having urinary urgency.

## 2015-03-07 ENCOUNTER — Emergency Department (HOSPITAL_BASED_OUTPATIENT_CLINIC_OR_DEPARTMENT_OTHER)
Admission: EM | Admit: 2015-03-07 | Discharge: 2015-03-07 | Disposition: A | Discharge status: Home / Self Care | Payer: Worker's Compensation | Attending: Emergency Medicine | Admitting: Emergency Medicine

## 2015-03-07 ENCOUNTER — Emergency Department (HOSPITAL_BASED_OUTPATIENT_CLINIC_OR_DEPARTMENT_OTHER): Payer: Worker's Compensation

## 2015-03-07 ENCOUNTER — Encounter (HOSPITAL_BASED_OUTPATIENT_CLINIC_OR_DEPARTMENT_OTHER): Payer: Self-pay | Admitting: Emergency Medicine

## 2015-03-07 DIAGNOSIS — X58XXXA Exposure to other specified factors, initial encounter: Secondary | ICD-10-CM | POA: Diagnosis not present

## 2015-03-07 DIAGNOSIS — Y9389 Activity, other specified: Secondary | ICD-10-CM | POA: Insufficient documentation

## 2015-03-07 DIAGNOSIS — Y9289 Other specified places as the place of occurrence of the external cause: Secondary | ICD-10-CM | POA: Diagnosis not present

## 2015-03-07 DIAGNOSIS — Y990 Civilian activity done for income or pay: Secondary | ICD-10-CM | POA: Insufficient documentation

## 2015-03-07 DIAGNOSIS — S6991XA Unspecified injury of right wrist, hand and finger(s), initial encounter: Secondary | ICD-10-CM

## 2015-03-07 DIAGNOSIS — Z87442 Personal history of urinary calculi: Secondary | ICD-10-CM | POA: Diagnosis not present

## 2015-03-07 DIAGNOSIS — Z79899 Other long term (current) drug therapy: Secondary | ICD-10-CM | POA: Insufficient documentation

## 2015-03-07 MED ORDER — OXYCODONE-ACETAMINOPHEN 5-325 MG PO TABS
ORAL_TABLET | ORAL | Status: AC
  Administered 2015-03-07: 1 via ORAL
  Filled 2015-03-07: qty 1

## 2015-03-07 MED ORDER — ACETAMINOPHEN 500 MG PO TABS
1000.0000 mg | ORAL_TABLET | Freq: Once | ORAL | Status: AC
  Administered 2015-03-07: 1000 mg via ORAL
  Filled 2015-03-07: qty 2

## 2015-03-07 MED ORDER — OXYCODONE-ACETAMINOPHEN 5-325 MG PO TABS
1.0000 | ORAL_TABLET | Freq: Once | ORAL | Status: AC
  Administered 2015-03-07: 1 via ORAL
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
## 2015-03-07 NOTE — ED Notes (Signed)
Patient transported to X-ray 

## 2015-03-07 NOTE — Discharge Instructions (Signed)
Return to the emergency room with worsening of symptoms, new symptoms or with symptoms that are concerning, especially fevers, redness, weakness, numbness, tingling, swelling, unable to move wrist.  RICE: Rest, Ice (three cycles of 20 mins on, 63mins off at least twice a day), compression/brace, elevation. Heating pad works well for back pain. Ibuprofen 400mg  (2 tablets 200mg ) every 5-6 hours for 3-5 days. Follow up with PCP/orthopedist if symptoms worsen or are persistent. Read below information and follow recommendations. Wrist Pain Wrist injuries are frequent in adults and children. A sprain is an injury to the ligaments that hold your bones together. A strain is an injury to muscle or muscle cord-like structures (tendons) from stretching or pulling. Generally, when wrists are moderately tender to touch following a fall or injury, a break in the bone (fracture) may be present. Most wrist sprains or strains are better in 3 to 5 days, but complete healing may take several weeks. HOME CARE INSTRUCTIONS   Put ice on the injured area.  Put ice in a plastic bag.  Place a towel between your skin and the bag.  Leave the ice on for 15-20 minutes, 3-4 times a day, for the first 2 days, or as directed by your health care provider.  Keep your arm raised above the level of your heart whenever possible to reduce swelling and pain.  Rest the injured area for at least 48 hours or as directed by your health care provider.  If a splint or elastic bandage has been applied, use it for as long as directed by your health care provider or until seen by a health care provider for a follow-up exam.  Only take over-the-counter or prescription medicines for pain, discomfort, or fever as directed by your health care provider.  Keep all follow-up appointments. You may need to follow up with a specialist or have follow-up X-rays. Improvement in pain level is not a guarantee that you did not fracture a bone in your  wrist. The only way to determine whether or not you have a broken bone is by X-ray. SEEK IMMEDIATE MEDICAL CARE IF:   Your fingers are swollen, very red, white, or cold and blue.  Your fingers are numb or tingling.  You have increasing pain.  You have difficulty moving your fingers. MAKE SURE YOU:   Understand these instructions.  Will watch your condition.  Will get help right away if you are not doing well or get worse. Document Released: 08/20/2005 Document Revised: 11/15/2013 Document Reviewed: 01/01/2011 [facility] Patient Information 2015 [place], [place]. This information is not intended to replace advice given to you by your health care provider. Make sure you discuss any questions you have with your health care provider.

## 2015-03-07 NOTE — ED Notes (Signed)
Patient was at work and ran into a door hurting her right wrist

## 2015-03-14 ENCOUNTER — Emergency Department (HOSPITAL_BASED_OUTPATIENT_CLINIC_OR_DEPARTMENT_OTHER): Payer: Worker's Compensation

## 2015-03-14 ENCOUNTER — Emergency Department (HOSPITAL_BASED_OUTPATIENT_CLINIC_OR_DEPARTMENT_OTHER)
Admission: EM | Admit: 2015-03-14 | Discharge: 2015-03-14 | Disposition: A | Discharge status: Home / Self Care | Payer: Worker's Compensation | Attending: Emergency Medicine | Admitting: Emergency Medicine

## 2015-03-14 ENCOUNTER — Encounter (HOSPITAL_BASED_OUTPATIENT_CLINIC_OR_DEPARTMENT_OTHER): Payer: Self-pay

## 2015-03-14 DIAGNOSIS — Z87828 Personal history of other (healed) physical injury and trauma: Secondary | ICD-10-CM | POA: Diagnosis not present

## 2015-03-14 DIAGNOSIS — M79601 Pain in right arm: Secondary | ICD-10-CM

## 2015-03-14 DIAGNOSIS — M79602 Pain in left arm: Secondary | ICD-10-CM

## 2015-03-14 DIAGNOSIS — Z87442 Personal history of urinary calculi: Secondary | ICD-10-CM | POA: Insufficient documentation

## 2015-03-14 DIAGNOSIS — Z79899 Other long term (current) drug therapy: Secondary | ICD-10-CM | POA: Insufficient documentation

## 2015-03-14 DIAGNOSIS — M25531 Pain in right wrist: Secondary | ICD-10-CM | POA: Insufficient documentation

## 2015-03-14 DIAGNOSIS — S59911D Unspecified injury of right forearm, subsequent encounter: Secondary | ICD-10-CM

## 2015-03-14 DIAGNOSIS — G8911 Acute pain due to trauma: Secondary | ICD-10-CM | POA: Insufficient documentation

## 2015-03-14 LAB — CK TOTAL AND CKMB (NOT AT ARMC)
CK TOTAL: 87 U/L (ref 7–177)
CK, MB: 1.1 ng/mL (ref 0.3–4.0)
RELATIVE INDEX: INVALID (ref 0.0–2.5)

## 2015-03-14 MED ORDER — PROMETHAZINE HCL 25 MG/ML IJ SOLN
25.0000 mg | Freq: Once | INTRAMUSCULAR | Status: AC
  Administered 2015-03-14: 25 mg via INTRAMUSCULAR

## 2015-03-14 MED ORDER — PROMETHAZINE HCL 25 MG/ML IJ SOLN
INTRAMUSCULAR | Status: AC
  Filled 2015-03-14: qty 1

## 2015-03-14 MED ORDER — TRAMADOL HCL 50 MG PO TABS: 50.0000 mg | ORAL_TABLET | Freq: Four times a day (QID) | ORAL | Status: DC | PRN

## 2015-03-14 MED ORDER — ONDANSETRON 4 MG PO TBDP
4.0000 mg | ORAL_TABLET | Freq: Once | ORAL | Status: AC
  Administered 2015-03-14: 4 mg via ORAL
  Filled 2015-03-14: qty 1

## 2015-03-14 MED ORDER — HYDROMORPHONE HCL 1 MG/ML IJ SOLN
2.0000 mg | Freq: Once | INTRAMUSCULAR | Status: AC
  Administered 2015-03-14: 2 mg via INTRAMUSCULAR
  Filled 2015-03-14: qty 2

## 2015-03-14 NOTE — Discharge Instructions (Signed)
Your work up was negative for any abnormality.  We do not have an explanation for your pain level considering your minor injury. You may take the medication I am prescribing for pain.  Plesae follow up as soon as possible with an orthopedic physician for further evalutaion. Wrist Pain Wrist injuries are frequent in adults and children. A sprain is an injury to the ligaments that hold your bones together. A strain is an injury to muscle or muscle cord-like structures (tendons) from stretching or pulling. Generally, when wrists are moderately tender to touch following a fall or injury, a break in the bone (fracture) may be present. Most wrist sprains or strains are better in 3 to 5 days, but complete healing may take several weeks. HOME CARE INSTRUCTIONS   Put ice on the injured area.  Put ice in a plastic bag.  Place a towel between your skin and the bag.  Leave the ice on for 15-20 minutes, 3-4 times a day, for the first 2 days, or as directed by your health care provider.  Keep your arm raised above the level of your heart whenever possible to reduce swelling and pain.  Rest the injured area for at least 48 hours or as directed by your health care provider.  If a splint or elastic bandage has been applied, use it for as long as directed by your health care provider or until seen by a health care provider for a follow-up exam.  Only take over-the-counter or prescription medicines for pain, discomfort, or fever as directed by your health care provider.  Keep all follow-up appointments. You may need to follow up with a specialist or have follow-up X-rays. Improvement in pain level is not a guarantee that you did not fracture a bone in your wrist. The only way to determine whether or not you have a broken bone is by X-ray. SEEK IMMEDIATE MEDICAL CARE IF:   Your fingers are swollen, very red, white, or cold and blue.  Your fingers are numb or tingling.  You have increasing pain.  You  have difficulty moving your fingers. MAKE SURE YOU:   Understand these instructions.  Will watch your condition.  Will get help right away if you are not doing well or get worse. Document Released: 08/20/2005 Document Revised: 11/15/2013 Document Reviewed: 01/01/2011 [facility] Patient Information 2015 [place], [place]. This information is not intended to replace advice given to you by your health care provider. Make sure you discuss any questions you have with your health care provider.

## 2015-03-14 NOTE — ED Notes (Signed)
Unable to reassess pt pain and nausea d/t pt in xray.

## 2015-03-14 NOTE — ED Notes (Signed)
Pt has 2+ radial pulses and nl cap refill.

## 2015-03-14 NOTE — ED Notes (Signed)
Pt had to be shaken awake to question her about her pain level and nausea. Pt yells when right arm is touched. Pt remains with nl pulses and nl cap refill.

## 2015-03-14 NOTE — ED Notes (Signed)
Pt actively vomiting-EDPA notified-orders received

## 2015-03-14 NOTE — ED Notes (Signed)
Pt reports injured last week at work.  Reports swung arm and hit arm on door.  Pt was seen last week here and wants a recheck due to pain increase and not getting better.  Pt currently has brace on right wrist.
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
## 2015-03-14 NOTE — ED Notes (Signed)
Pt's husband to nse desk-states they are ready to go

## 2015-05-17 ENCOUNTER — Emergency Department (HOSPITAL_BASED_OUTPATIENT_CLINIC_OR_DEPARTMENT_OTHER): Payer: Worker's Compensation

## 2015-05-17 ENCOUNTER — Encounter (HOSPITAL_BASED_OUTPATIENT_CLINIC_OR_DEPARTMENT_OTHER): Payer: Self-pay | Admitting: *Deleted

## 2015-05-17 ENCOUNTER — Emergency Department (HOSPITAL_BASED_OUTPATIENT_CLINIC_OR_DEPARTMENT_OTHER)
Admission: EM | Admit: 2015-05-17 | Discharge: 2015-05-17 | Disposition: A | Discharge status: Home / Self Care | Payer: Worker's Compensation | Attending: Emergency Medicine | Admitting: Emergency Medicine

## 2015-05-17 DIAGNOSIS — M79601 Pain in right arm: Secondary | ICD-10-CM | POA: Diagnosis not present

## 2015-05-17 DIAGNOSIS — Z87828 Personal history of other (healed) physical injury and trauma: Secondary | ICD-10-CM | POA: Insufficient documentation

## 2015-05-17 DIAGNOSIS — Z79899 Other long term (current) drug therapy: Secondary | ICD-10-CM | POA: Diagnosis not present

## 2015-05-17 DIAGNOSIS — Z87442 Personal history of urinary calculi: Secondary | ICD-10-CM | POA: Insufficient documentation

## 2015-05-17 MED ORDER — CYCLOBENZAPRINE HCL 10 MG PO TABS: 10.0000 mg | ORAL_TABLET | Freq: Three times a day (TID) | ORAL | Status: DC | PRN

## 2015-05-17 MED ORDER — TRAMADOL HCL 50 MG PO TABS: 50.0000 mg | ORAL_TABLET | Freq: Four times a day (QID) | ORAL | Status: DC | PRN
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
## 2015-05-17 NOTE — Discharge Instructions (Signed)
Take tramadol as directed for pain. No driving or operating heavy machinery while taking flexeril. This medication may make you drowsy. Apply ice alternated with heat to your arm. Follow-up with orthopedics.  Musculoskeletal Pain Musculoskeletal pain is muscle and boney aches and pains. These pains can occur in any part of the body. Your caregiver may treat you without knowing the cause of the pain. They may treat you if blood or urine tests, X-rays, and other tests were normal.  CAUSES There is often not a definite cause or reason for these pains. These pains may be caused by a type of germ (virus). The discomfort may also come from overuse. Overuse includes working out too hard when your body is not fit. Boney aches also come from weather changes. Bone is sensitive to atmospheric pressure changes. HOME CARE INSTRUCTIONS   Ask when your test results will be ready. Make sure you get your test results.  Only take over-the-counter or prescription medicines for pain, discomfort, or fever as directed by your caregiver. If you were given medications for your condition, do not drive, operate machinery or power tools, or sign legal documents for 24 hours. Do not drink alcohol. Do not take sleeping pills or other medications that may interfere with treatment.  Continue all activities unless the activities cause more pain. When the pain lessens, slowly resume normal activities. Gradually increase the intensity and duration of the activities or exercise.  During periods of severe pain, bed rest may be helpful. Lay or sit in any position that is comfortable.  Putting ice on the injured area.  Put ice in a bag.  Place a towel between your skin and the bag.  Leave the ice on for 15 to 20 minutes, 3 to 4 times a day.  Follow up with your caregiver for continued problems and no reason can be found for the pain. If the pain becomes worse or does not go away, it may be necessary to repeat tests or do  additional testing. Your caregiver may need to look further for a possible cause. SEEK IMMEDIATE MEDICAL CARE IF:  You have pain that is getting worse and is not relieved by medications.  You develop chest pain that is associated with shortness or breath, sweating, feeling sick to your stomach (nauseous), or throw up (vomit).  Your pain becomes localized to the abdomen.  You develop any new symptoms that seem different or that concern you. MAKE SURE YOU:   Understand these instructions.  Will watch your condition.  Will get help right away if you are not doing well or get worse. Document Released: 11/10/2005 Document Revised: 02/02/2012 Document Reviewed: 07/15/2013 [facility] Patient Information 2015 [place], [place]. This information is not intended to replace advice given to you by your health care provider. Make sure you discuss any questions you have with your health care provider.

## 2015-10-18 ENCOUNTER — Emergency Department (HOSPITAL_BASED_OUTPATIENT_CLINIC_OR_DEPARTMENT_OTHER)
Admission: EM | Admit: 2015-10-18 | Discharge: 2015-10-18 | Disposition: A | Discharge status: Home / Self Care | Payer: PRIVATE HEALTH INSURANCE | Attending: Emergency Medicine | Admitting: Emergency Medicine

## 2015-10-18 ENCOUNTER — Emergency Department (HOSPITAL_BASED_OUTPATIENT_CLINIC_OR_DEPARTMENT_OTHER): Payer: PRIVATE HEALTH INSURANCE

## 2015-10-18 ENCOUNTER — Encounter (HOSPITAL_BASED_OUTPATIENT_CLINIC_OR_DEPARTMENT_OTHER): Payer: Self-pay | Admitting: Emergency Medicine

## 2015-10-18 DIAGNOSIS — S3992XA Unspecified injury of lower back, initial encounter: Secondary | ICD-10-CM | POA: Insufficient documentation

## 2015-10-18 DIAGNOSIS — S79911A Unspecified injury of right hip, initial encounter: Secondary | ICD-10-CM | POA: Diagnosis not present

## 2015-10-18 DIAGNOSIS — Y92410 Unspecified street and highway as the place of occurrence of the external cause: Secondary | ICD-10-CM | POA: Insufficient documentation

## 2015-10-18 DIAGNOSIS — T148 Other injury of unspecified body region: Secondary | ICD-10-CM | POA: Diagnosis not present

## 2015-10-18 DIAGNOSIS — Z87442 Personal history of urinary calculi: Secondary | ICD-10-CM | POA: Insufficient documentation

## 2015-10-18 DIAGNOSIS — Y9389 Activity, other specified: Secondary | ICD-10-CM | POA: Diagnosis not present

## 2015-10-18 DIAGNOSIS — Z3202 Encounter for pregnancy test, result negative: Secondary | ICD-10-CM | POA: Insufficient documentation

## 2015-10-18 DIAGNOSIS — S59911A Unspecified injury of right forearm, initial encounter: Secondary | ICD-10-CM | POA: Diagnosis present

## 2015-10-18 DIAGNOSIS — S199XXA Unspecified injury of neck, initial encounter: Secondary | ICD-10-CM | POA: Insufficient documentation

## 2015-10-18 DIAGNOSIS — S299XXA Unspecified injury of thorax, initial encounter: Secondary | ICD-10-CM | POA: Diagnosis not present

## 2015-10-18 DIAGNOSIS — Z79899 Other long term (current) drug therapy: Secondary | ICD-10-CM | POA: Insufficient documentation

## 2015-10-18 DIAGNOSIS — Y998 Other external cause status: Secondary | ICD-10-CM | POA: Insufficient documentation

## 2015-10-18 DIAGNOSIS — T07XXXA Unspecified multiple injuries, initial encounter: Secondary | ICD-10-CM

## 2015-10-18 DIAGNOSIS — S3991XA Unspecified injury of abdomen, initial encounter: Secondary | ICD-10-CM | POA: Diagnosis not present

## 2015-10-18 LAB — COMPREHENSIVE METABOLIC PANEL
ALBUMIN: 4.1 g/dL (ref 3.5–5.0)
ALK PHOS: 46 U/L (ref 38–126)
ALT: 19 U/L (ref 14–54)
AST: 17 U/L (ref 15–41)
Anion gap: 8 (ref 5–15)
BILIRUBIN TOTAL: 0.4 mg/dL (ref 0.3–1.2)
BUN: 9 mg/dL (ref 6–20)
CALCIUM: 9.6 mg/dL (ref 8.9–10.3)
CO2: 24 mmol/L (ref 22–32)
CREATININE: 0.73 mg/dL (ref 0.44–1.00)
Chloride: 106 mmol/L (ref 101–111)
GFR calc non Af Amer: >60 mL/min (ref >60)
GLUCOSE: 97 mg/dL (ref 65–99)
Potassium: 3.6 mmol/L (ref 3.5–5.1)
SODIUM: 138 mmol/L (ref 135–145)
TOTAL PROTEIN: 8.3 g/dL — AB (ref 6.5–8.1)

## 2015-10-18 LAB — URINALYSIS, ROUTINE W REFLEX MICROSCOPIC
Bilirubin Urine: NEGATIVE
Glucose, UA: NEGATIVE mg/dL
Hgb urine dipstick: NEGATIVE
KETONES UR: NEGATIVE mg/dL
NITRITE: NEGATIVE
PROTEIN: NEGATIVE mg/dL
Specific Gravity, Urine: 1.013 (ref 1.005–1.030)
pH: 6 (ref 5.0–8.0)

## 2015-10-18 LAB — CBC
HEMATOCRIT: 34.4 % — AB (ref 36.0–46.0)
HEMOGLOBIN: 11 g/dL — AB (ref 12.0–15.0)
MCH: 25.5 pg — ABNORMAL LOW (ref 26.0–34.0)
MCHC: 32 g/dL (ref 30.0–36.0)
MCV: 79.8 fL (ref 78.0–100.0)
Platelets: 308 10*3/uL (ref 150–400)
RBC: 4.31 MIL/uL (ref 3.87–5.11)
RDW: 14.4 % (ref 11.5–15.5)
WBC: 5.2 10*3/uL (ref 4.0–10.5)

## 2015-10-18 LAB — URINE MICROSCOPIC-ADD ON

## 2015-10-18 LAB — ETHANOL

## 2015-10-18 LAB — PREGNANCY, URINE: PREG TEST UR: NEGATIVE

## 2015-10-18 MED ORDER — IOHEXOL 300 MG/ML  SOLN
100.0000 mL | Freq: Once | INTRAMUSCULAR | Status: AC | PRN
  Administered 2015-10-18: 100 mL via INTRAVENOUS

## 2015-10-18 MED ORDER — ONDANSETRON HCL 4 MG/2ML IJ SOLN
4.0000 mg | Freq: Once | INTRAMUSCULAR | Status: AC
  Administered 2015-10-18: 4 mg via INTRAVENOUS
  Filled 2015-10-18: qty 2

## 2015-10-18 MED ORDER — MORPHINE SULFATE (PF) 4 MG/ML IV SOLN
4.0000 mg | Freq: Once | INTRAVENOUS | Status: AC
  Administered 2015-10-18: 4 mg via INTRAVENOUS
  Filled 2015-10-18: qty 1

## 2015-10-18 MED ORDER — SODIUM CHLORIDE 0.9 % IV SOLN
INTRAVENOUS | Status: DC
  Administered 2015-10-18: 1000 mL via INTRAVENOUS

## 2015-10-18 MED ORDER — TRAMADOL HCL 50 MG PO TABS: 50.0000 mg | ORAL_TABLET | Freq: Four times a day (QID) | ORAL | Status: DC | PRN
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
## 2015-10-24 ENCOUNTER — Encounter (HOSPITAL_BASED_OUTPATIENT_CLINIC_OR_DEPARTMENT_OTHER): Payer: Self-pay | Admitting: *Deleted

## 2015-10-24 ENCOUNTER — Emergency Department (HOSPITAL_BASED_OUTPATIENT_CLINIC_OR_DEPARTMENT_OTHER)
Admission: EM | Admit: 2015-10-24 | Discharge: 2015-10-24 | Disposition: A | Discharge status: Home / Self Care | Payer: PRIVATE HEALTH INSURANCE | Attending: Emergency Medicine | Admitting: Emergency Medicine

## 2015-10-24 DIAGNOSIS — Z87828 Personal history of other (healed) physical injury and trauma: Secondary | ICD-10-CM | POA: Insufficient documentation

## 2015-10-24 DIAGNOSIS — Y9389 Activity, other specified: Secondary | ICD-10-CM | POA: Insufficient documentation

## 2015-10-24 DIAGNOSIS — Y92410 Unspecified street and highway as the place of occurrence of the external cause: Secondary | ICD-10-CM | POA: Diagnosis not present

## 2015-10-24 DIAGNOSIS — M62830 Muscle spasm of back: Secondary | ICD-10-CM | POA: Diagnosis not present

## 2015-10-24 DIAGNOSIS — Z87442 Personal history of urinary calculi: Secondary | ICD-10-CM | POA: Insufficient documentation

## 2015-10-24 DIAGNOSIS — Y998 Other external cause status: Secondary | ICD-10-CM | POA: Insufficient documentation

## 2015-10-24 DIAGNOSIS — S3992XA Unspecified injury of lower back, initial encounter: Secondary | ICD-10-CM | POA: Diagnosis present

## 2015-10-24 LAB — URINALYSIS, ROUTINE W REFLEX MICROSCOPIC
Bilirubin Urine: NEGATIVE
Glucose, UA: NEGATIVE mg/dL
KETONES UR: 15 mg/dL — AB
NITRITE: NEGATIVE
PROTEIN: NEGATIVE mg/dL
Specific Gravity, Urine: 1.012 (ref 1.005–1.030)
pH: 6 (ref 5.0–8.0)

## 2015-10-24 LAB — URINE MICROSCOPIC-ADD ON

## 2015-10-24 MED ORDER — BUPIVACAINE HCL 0.5 % IJ SOLN
20.0000 mL | Freq: Once | INTRAMUSCULAR | Status: AC
  Administered 2015-10-24: 20 mL
  Filled 2015-10-24: qty 1

## 2015-10-24 NOTE — Discharge Instructions (Signed)

## 2015-10-24 NOTE — ED Notes (Signed)
Dr. Laneta Simmers at bedside to do injection.
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
## 2015-10-24 NOTE — ED Notes (Signed)
Pt assisted to BR. Urine looked cloudy, sent for u/a.
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
## 2016-01-21 IMAGING — CT CT HEAD W/O CM
1 series · 16 of 29 positions shown, 20 images · non-contrast
Comparison: 09/15/2011.

CLINICAL DATA: Sudden onset dizziness and headache.

EXAM:
CT HEAD WITHOUT CONTRAST
TECHNIQUE: Contiguous axial images were obtained from the base of the skull
through the vertex without intravenous contrast.

[Series 2: head 5.0 h30s · axial · 0.40mm/px · z∈[-48,+82]mm · 16 of 29 slices shown, 20 images]
[im 2/29  brain]
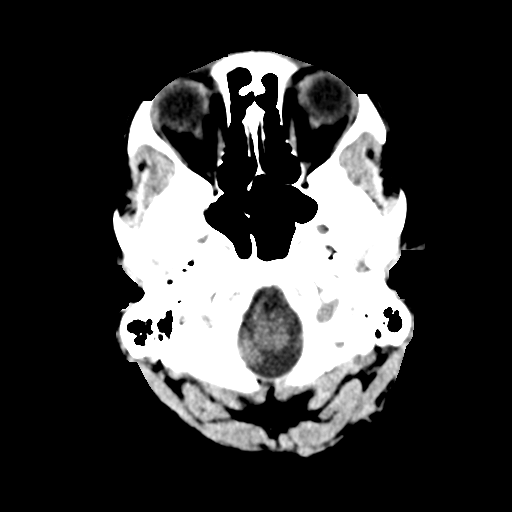
[im 2/29  bone]
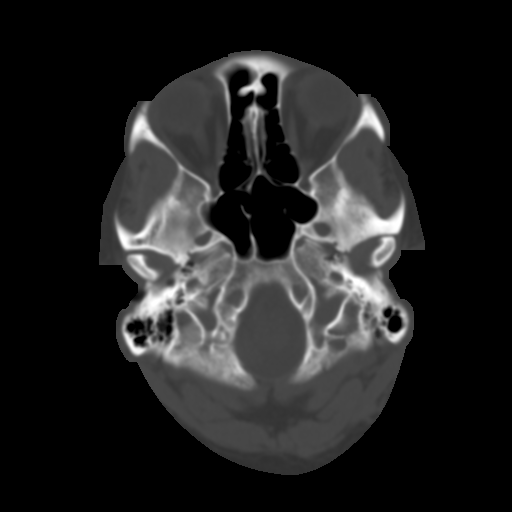
[im 4/29  brain]
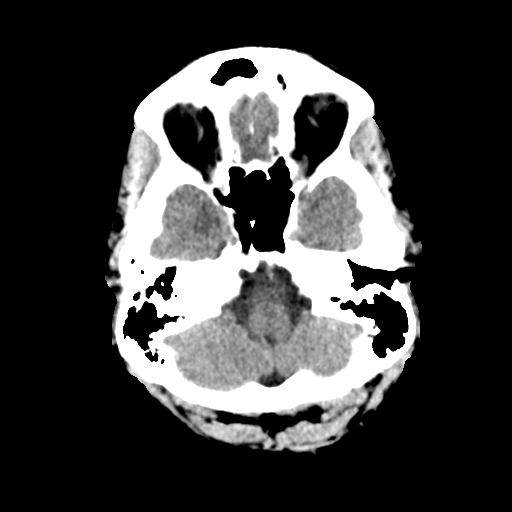
[im 6/29  brain]
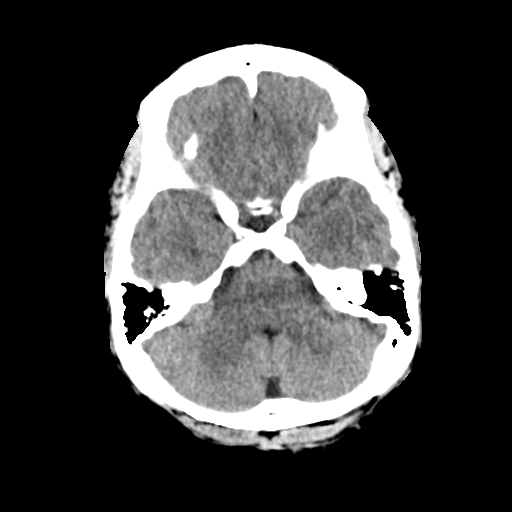
[im 7/29  brain]
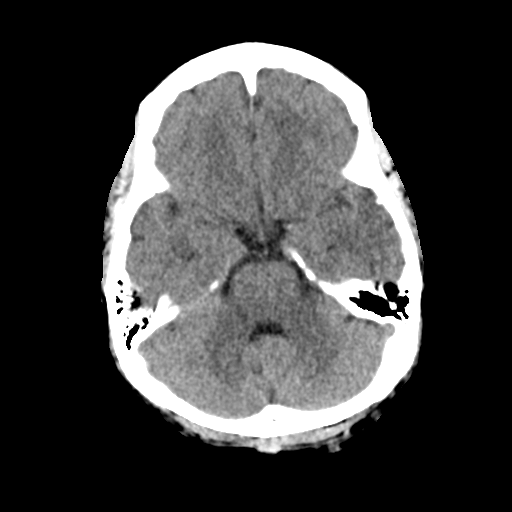
[im 9/29  brain]
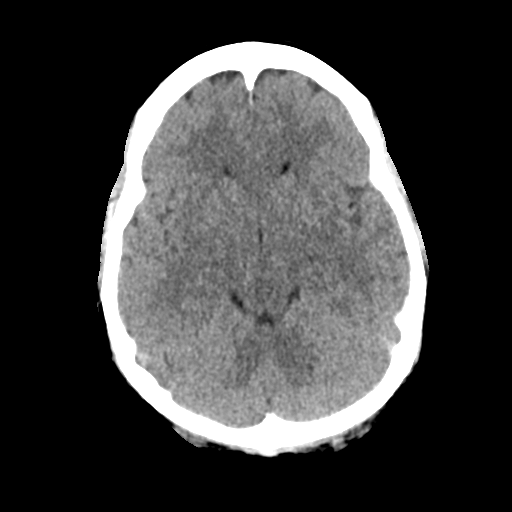
[im 9/29  bone]
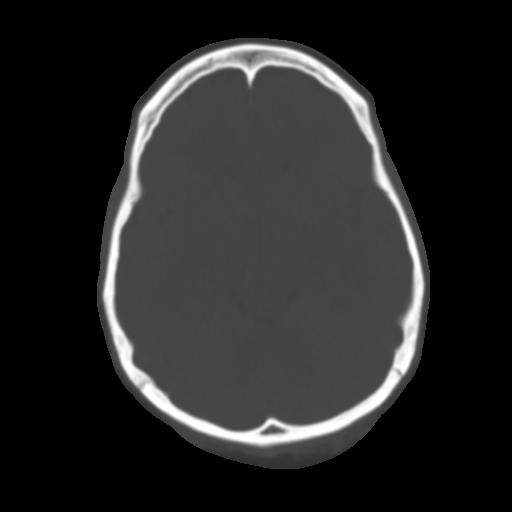
[im 11/29  brain]
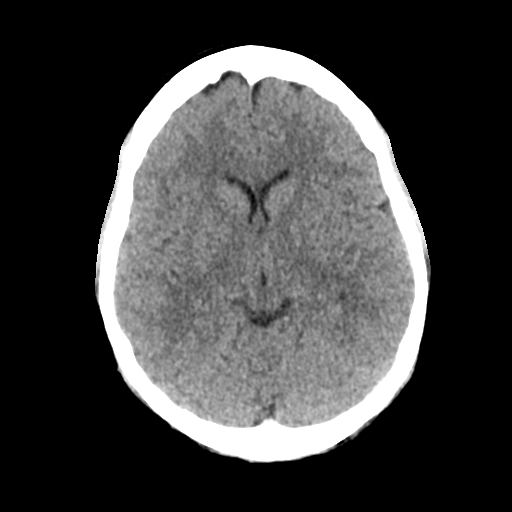
[im 12/29  brain]
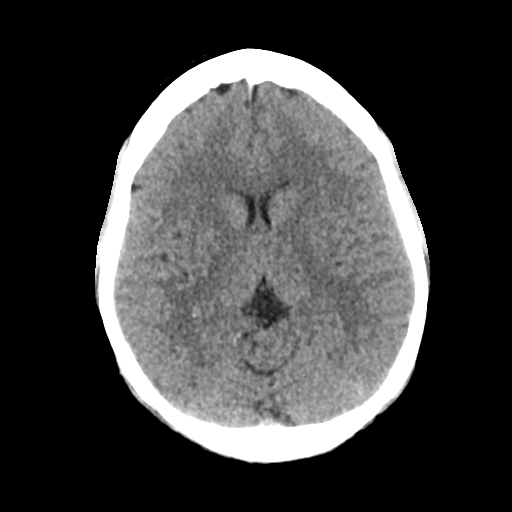
[im 14/29  brain]
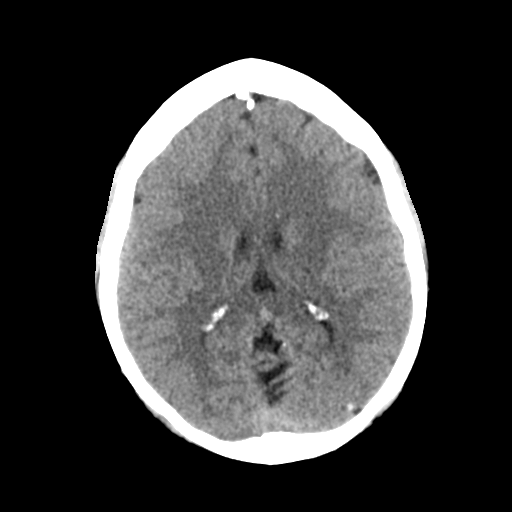
[im 16/29  brain]
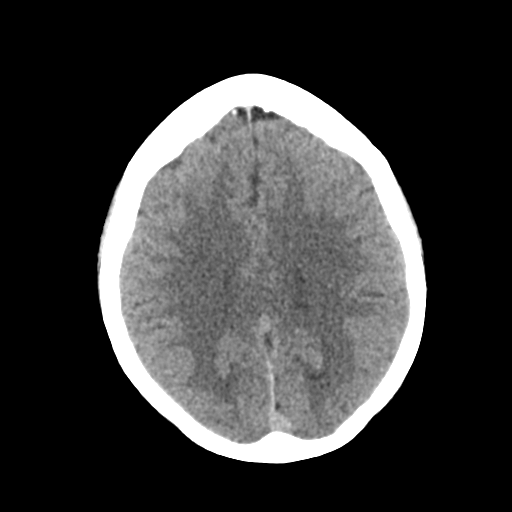
[im 16/29  bone]
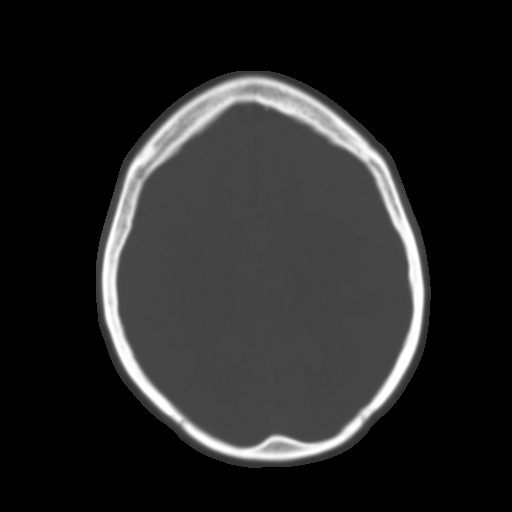
[im 18/29  brain]
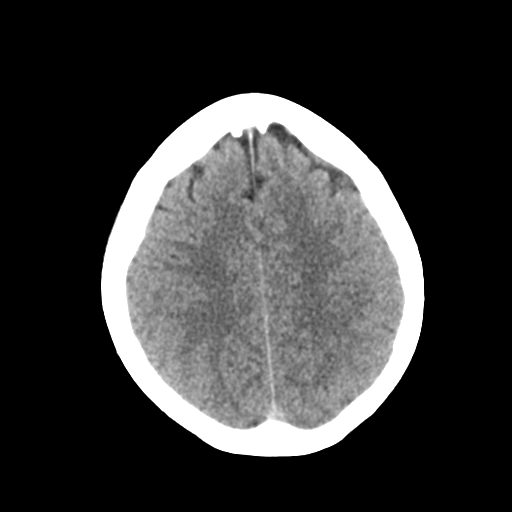
[im 19/29  brain]
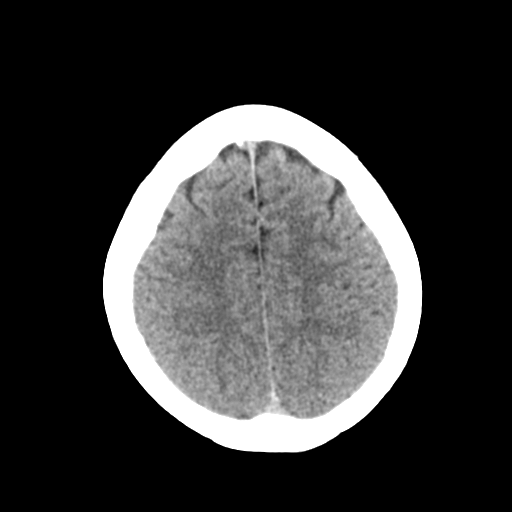
[im 21/29  brain]
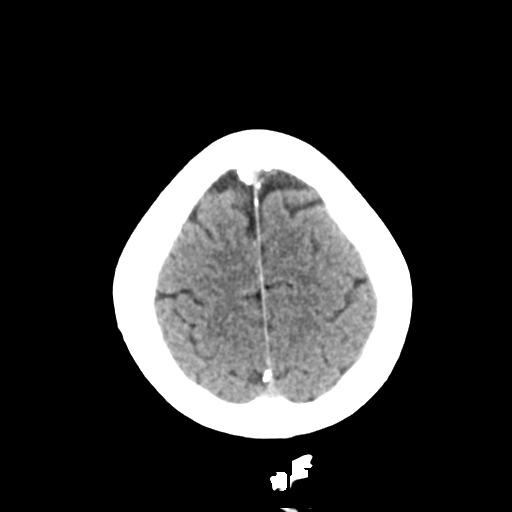
[im 23/29  brain]
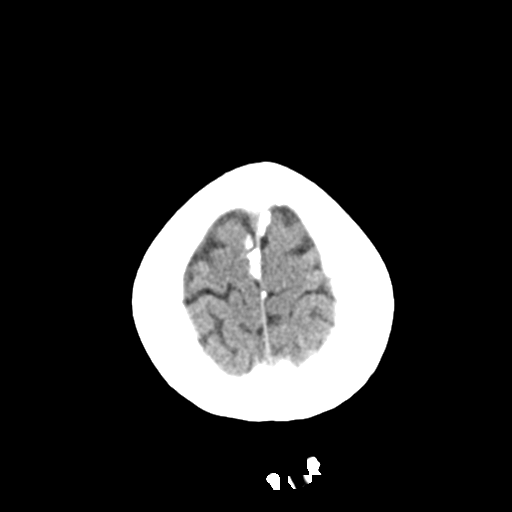
[im 23/29  bone]
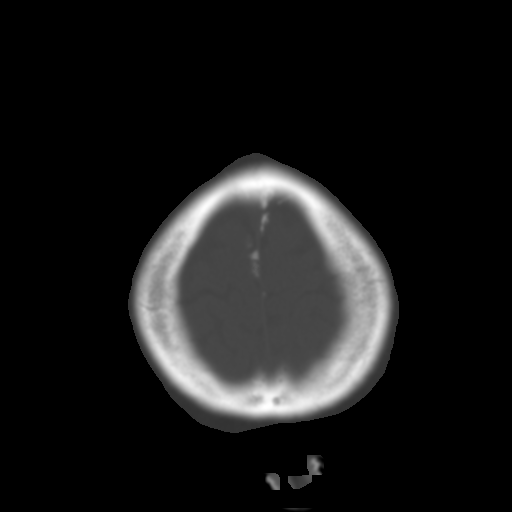
[im 24/29  brain]
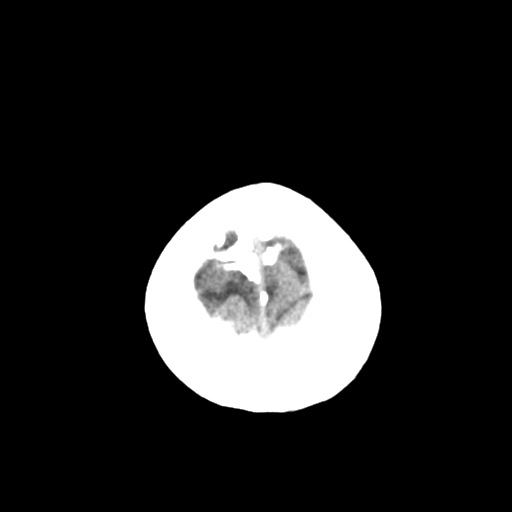
[im 26/29  brain]
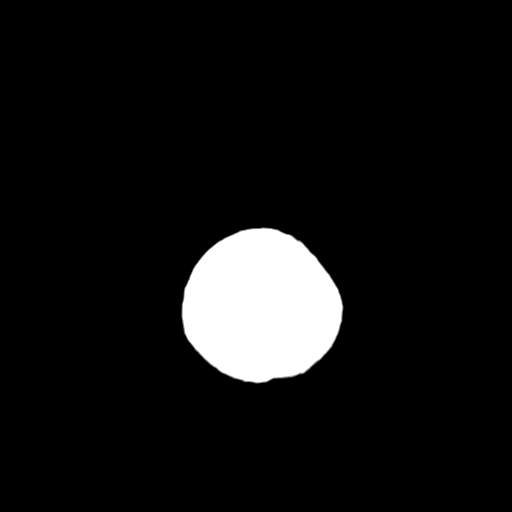
[im 28/29  brain]
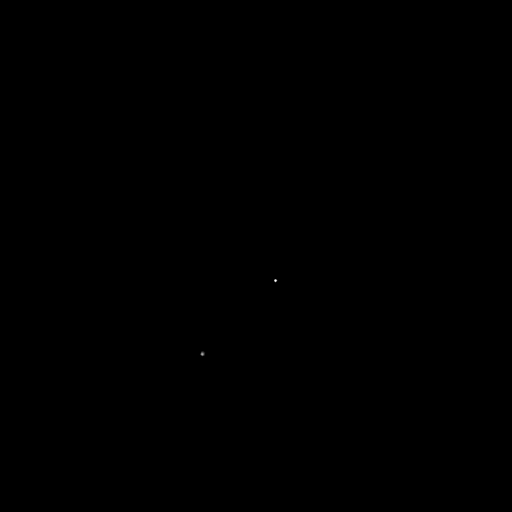

[16 of 29 positions shown; findings below may reference images not displayed]

FINDINGS: Normal appearing cerebral hemispheres and posterior fossa
structures. Normal size and position of the ventricles. No
intracranial hemorrhage, mass lesion or CT evidence of acute
infarction. Unremarkable bones and included paranasal sinuses.
IMPRESSION: Normal examination.

## 2016-06-10 ENCOUNTER — Emergency Department (HOSPITAL_BASED_OUTPATIENT_CLINIC_OR_DEPARTMENT_OTHER): Payer: Self-pay

## 2016-06-10 ENCOUNTER — Emergency Department (HOSPITAL_BASED_OUTPATIENT_CLINIC_OR_DEPARTMENT_OTHER)
Admission: EM | Admit: 2016-06-10 | Discharge: 2016-06-10 | Disposition: A | Discharge status: Home / Self Care | Payer: Self-pay | Attending: Emergency Medicine | Admitting: Emergency Medicine

## 2016-06-10 ENCOUNTER — Encounter (HOSPITAL_BASED_OUTPATIENT_CLINIC_OR_DEPARTMENT_OTHER): Payer: Self-pay | Admitting: Emergency Medicine

## 2016-06-10 DIAGNOSIS — R109 Unspecified abdominal pain: Secondary | ICD-10-CM | POA: Insufficient documentation

## 2016-06-10 LAB — URINALYSIS, ROUTINE W REFLEX MICROSCOPIC
Bilirubin Urine: NEGATIVE
GLUCOSE, UA: NEGATIVE mg/dL
Hgb urine dipstick: NEGATIVE
Ketones, ur: NEGATIVE mg/dL
LEUKOCYTES UA: NEGATIVE
Nitrite: NEGATIVE
PH: 6 (ref 5.0–8.0)
Protein, ur: NEGATIVE mg/dL
SPECIFIC GRAVITY, URINE: 1.026 (ref 1.005–1.030)

## 2016-06-10 LAB — PREGNANCY, URINE: Preg Test, Ur: NEGATIVE

## 2016-06-10 MED ORDER — TRAMADOL HCL 50 MG PO TABS: 50.0000 mg | ORAL_TABLET | Freq: Four times a day (QID) | ORAL | Status: DC | PRN

## 2016-06-10 MED ORDER — ONDANSETRON 8 MG PO TBDP
8.0000 mg | ORAL_TABLET | Freq: Once | ORAL | Status: AC
  Administered 2016-06-10: 8 mg via ORAL
  Filled 2016-06-10: qty 1

## 2016-06-10 MED ORDER — SODIUM CHLORIDE 0.9 % IV BOLUS (SEPSIS): 500.0000 mL | Freq: Once | INTRAVENOUS | Status: DC

## 2016-06-10 MED ORDER — TRAMADOL HCL 50 MG PO TABS
50.0000 mg | ORAL_TABLET | Freq: Once | ORAL | Status: AC
  Administered 2016-06-10: 50 mg via ORAL
  Filled 2016-06-10: qty 1

## 2016-06-10 MED ORDER — LIDOCAINE 5 % EX PTCH: 1.0000 | MEDICATED_PATCH | CUTANEOUS | Status: DC

## 2016-06-10 MED ORDER — FENTANYL CITRATE (PF) 100 MCG/2ML IJ SOLN
100.0000 ug | Freq: Once | INTRAMUSCULAR | Status: DC
  Filled 2016-06-10: qty 2

## 2016-06-10 MED ORDER — FENTANYL CITRATE (PF) 100 MCG/2ML IJ SOLN
100.0000 ug | Freq: Once | INTRAMUSCULAR | Status: AC
  Administered 2016-06-10: 100 ug via INTRAMUSCULAR

## 2016-06-10 NOTE — ED Notes (Signed)
Pt c/o nausea. Dr. Randal Buba made aware.

## 2016-06-10 NOTE — ED Notes (Signed)
Pt c/o left sided abd pain that radiates to left flank area. Denies any n/v/d. Pt speaking in whisper stating she lost her voice x 3 days ago.
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
## 2016-08-25 ENCOUNTER — Emergency Department (HOSPITAL_BASED_OUTPATIENT_CLINIC_OR_DEPARTMENT_OTHER): Payer: Self-pay

## 2016-08-25 ENCOUNTER — Encounter (HOSPITAL_BASED_OUTPATIENT_CLINIC_OR_DEPARTMENT_OTHER): Payer: Self-pay | Admitting: Emergency Medicine

## 2016-08-25 ENCOUNTER — Emergency Department (HOSPITAL_BASED_OUTPATIENT_CLINIC_OR_DEPARTMENT_OTHER)
Admission: EM | Admit: 2016-08-25 | Discharge: 2016-08-25 | Disposition: A | Discharge status: Home / Self Care | Payer: Self-pay | Attending: Emergency Medicine | Admitting: Emergency Medicine

## 2016-08-25 DIAGNOSIS — R059 Cough, unspecified: Secondary | ICD-10-CM

## 2016-08-25 DIAGNOSIS — Z79899 Other long term (current) drug therapy: Secondary | ICD-10-CM | POA: Insufficient documentation

## 2016-08-25 DIAGNOSIS — J069 Acute upper respiratory infection, unspecified: Secondary | ICD-10-CM | POA: Insufficient documentation

## 2016-08-25 DIAGNOSIS — R05 Cough: Secondary | ICD-10-CM

## 2016-08-25 MED ORDER — AZITHROMYCIN 250 MG PO TABS: 250.0000 mg | ORAL_TABLET | Freq: Every day | ORAL | 0 refills | Status: DC

## 2016-08-25 MED ORDER — HYDROCODONE-HOMATROPINE 5-1.5 MG/5ML PO SYRP: 5.0000 mL | ORAL_SOLUTION | Freq: Four times a day (QID) | ORAL | 0 refills | Status: DC | PRN

## 2016-08-25 MED ORDER — ALBUTEROL SULFATE (2.5 MG/3ML) 0.083% IN NEBU
5.0000 mg | INHALATION_SOLUTION | Freq: Once | RESPIRATORY_TRACT | Status: AC
  Administered 2016-08-25: 5 mg via RESPIRATORY_TRACT
  Filled 2016-08-25: qty 6

## 2016-08-25 MED ORDER — PREDNISONE 50 MG PO TABS
60.0000 mg | ORAL_TABLET | Freq: Once | ORAL | Status: AC
  Administered 2016-08-25: 60 mg via ORAL
  Filled 2016-08-25: qty 1

## 2016-08-25 MED ORDER — LIDOCAINE HCL (PF) 2 % IJ SOLN
INTRAMUSCULAR | Status: AC
  Administered 2016-08-25: 2 mL
  Filled 2016-08-25: qty 2

## 2016-08-25 MED ORDER — PREDNISONE 20 MG PO TABS: 40.0000 mg | ORAL_TABLET | Freq: Every day | ORAL | 0 refills | Status: DC

## 2016-08-25 MED FILL — AZITHROMYCIN 250 MG TABLET: 250 | 5 days supply | Qty: 6 | Fill #0

## 2016-08-25 MED FILL — HYDROCODONE-HOMATROPINE SYR: 5-1.5 | 6 days supply | Qty: 120 | Fill #0

## 2016-08-25 MED FILL — predniSONE 20 MG TABS: 20 | 5 days supply | Qty: 10 | Fill #0

## 2016-08-25 NOTE — ED Notes (Signed)
RT at bedside.

## 2016-08-25 NOTE — ED Notes (Signed)
Pt directed to pharmacy to pick up prescriptions
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
## 2016-08-25 NOTE — ED Triage Notes (Signed)
Pt c/o cough x 2 weeks that did not improve with Claritin and Mucinex. States she called EMS tonight while at work because "I got to coughing and couldn't catch my breath". Reports subjective fever but afebrile on arrival. Oxygen saturation 97 % RA.

## 2016-08-25 NOTE — ED Notes (Signed)
Pt experiencing uncontrolled coughing episode. EDP notified

## 2017-01-04 ENCOUNTER — Emergency Department (HOSPITAL_BASED_OUTPATIENT_CLINIC_OR_DEPARTMENT_OTHER)
Admission: EM | Admit: 2017-01-04 | Discharge: 2017-01-04 | Disposition: A | Discharge status: Home / Self Care | Payer: Self-pay | Attending: Emergency Medicine | Admitting: Emergency Medicine

## 2017-01-04 ENCOUNTER — Encounter (HOSPITAL_BASED_OUTPATIENT_CLINIC_OR_DEPARTMENT_OTHER): Payer: Self-pay | Admitting: *Deleted

## 2017-01-04 DIAGNOSIS — M26602 Left temporomandibular joint disorder, unspecified: Secondary | ICD-10-CM | POA: Insufficient documentation

## 2017-01-04 DIAGNOSIS — M26609 Unspecified temporomandibular joint disorder, unspecified side: Secondary | ICD-10-CM

## 2017-01-04 DIAGNOSIS — Z21 Asymptomatic human immunodeficiency virus [HIV] infection status: Secondary | ICD-10-CM | POA: Insufficient documentation

## 2017-01-04 HISTORY — DX: Human immunodeficiency virus (HIV) disease: B20

## 2017-01-04 HISTORY — DX: Asymptomatic human immunodeficiency virus (hiv) infection status: Z21

## 2017-01-04 MED ORDER — IBUPROFEN 800 MG PO TABS
800.0000 mg | ORAL_TABLET | Freq: Once | ORAL | Status: AC
  Administered 2017-01-04: 800 mg via ORAL
  Filled 2017-01-04: qty 1

## 2017-01-04 MED ORDER — DIAZEPAM 5 MG PO TABS: ORAL_TABLET | ORAL | 0 refills | Status: DC

## 2017-01-04 MED ORDER — ACETAMINOPHEN 500 MG PO TABS
1000.0000 mg | ORAL_TABLET | Freq: Once | ORAL | Status: AC
  Administered 2017-01-04: 1000 mg via ORAL
  Filled 2017-01-04: qty 2

## 2017-01-04 NOTE — ED Notes (Signed)
Pt discharged to home NAD.  
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
## 2017-05-06 ENCOUNTER — Emergency Department (HOSPITAL_BASED_OUTPATIENT_CLINIC_OR_DEPARTMENT_OTHER)
Admission: EM | Admit: 2017-05-06 | Discharge: 2017-05-06 | Disposition: A | Discharge status: Home / Self Care | Payer: Self-pay | Attending: Emergency Medicine | Admitting: Emergency Medicine

## 2017-05-06 DIAGNOSIS — Y990 Civilian activity done for income or pay: Secondary | ICD-10-CM | POA: Insufficient documentation

## 2017-05-06 DIAGNOSIS — S0501XA Injury of conjunctiva and corneal abrasion without foreign body, right eye, initial encounter: Secondary | ICD-10-CM | POA: Insufficient documentation

## 2017-05-06 DIAGNOSIS — Y9259 Other trade areas as the place of occurrence of the external cause: Secondary | ICD-10-CM | POA: Insufficient documentation

## 2017-05-06 DIAGNOSIS — W51XXXA Accidental striking against or bumped into by another person, initial encounter: Secondary | ICD-10-CM | POA: Insufficient documentation

## 2017-05-06 DIAGNOSIS — Y9301 Activity, walking, marching and hiking: Secondary | ICD-10-CM | POA: Insufficient documentation

## 2017-05-06 MED ORDER — HYDROCODONE-ACETAMINOPHEN 5-325 MG PO TABS
1.0000 | ORAL_TABLET | Freq: Once | ORAL | Status: AC
  Administered 2017-05-06: 1 via ORAL
  Filled 2017-05-06: qty 1

## 2017-05-06 MED ORDER — HYDROCODONE-ACETAMINOPHEN 5-325 MG PO TABS: 1.0000 | ORAL_TABLET | ORAL | 0 refills | Status: DC | PRN

## 2017-05-06 MED ORDER — FLUORESCEIN SODIUM 0.6 MG OP STRP
1.0000 | ORAL_STRIP | Freq: Once | OPHTHALMIC | Status: AC
  Administered 2017-05-06: 1 via OPHTHALMIC
  Filled 2017-05-06: qty 1

## 2017-05-06 MED ORDER — TETRACAINE HCL 0.5 % OP SOLN
2.0000 [drp] | Freq: Once | OPHTHALMIC | Status: AC
  Administered 2017-05-06: 2 [drp] via OPHTHALMIC
  Filled 2017-05-06: qty 4

## 2017-05-06 MED ORDER — ERYTHROMYCIN 5 MG/GM OP OINT
TOPICAL_OINTMENT | Freq: Four times a day (QID) | OPHTHALMIC | Status: DC
  Administered 2017-05-06: 1 via OPHTHALMIC
  Filled 2017-05-06: qty 3.5
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
## 2017-11-29 ENCOUNTER — Encounter (HOSPITAL_BASED_OUTPATIENT_CLINIC_OR_DEPARTMENT_OTHER): Payer: Self-pay | Admitting: Adult Health

## 2017-11-29 ENCOUNTER — Other Ambulatory Visit: Payer: Self-pay

## 2017-11-29 DIAGNOSIS — R102 Pelvic and perineal pain: Secondary | ICD-10-CM | POA: Insufficient documentation

## 2017-11-29 DIAGNOSIS — Z21 Asymptomatic human immunodeficiency virus [HIV] infection status: Secondary | ICD-10-CM | POA: Insufficient documentation

## 2017-11-29 LAB — URINALYSIS, ROUTINE W REFLEX MICROSCOPIC

## 2017-11-29 LAB — URINALYSIS, MICROSCOPIC (REFLEX)

## 2017-11-29 LAB — PREGNANCY, URINE: Preg Test, Ur: NEGATIVE

## 2017-11-29 NOTE — ED Triage Notes (Signed)
PResents with constant lower abdominal pain that radiates to back that began when she started her period on Friday. PAin is described as severe and progressive. Denies urinary symptoms and vaginal d/c.

## 2017-11-30 ENCOUNTER — Emergency Department (HOSPITAL_BASED_OUTPATIENT_CLINIC_OR_DEPARTMENT_OTHER): Payer: Self-pay

## 2017-11-30 ENCOUNTER — Other Ambulatory Visit (HOSPITAL_BASED_OUTPATIENT_CLINIC_OR_DEPARTMENT_OTHER): Payer: Self-pay | Admitting: Emergency Medicine

## 2017-11-30 ENCOUNTER — Ambulatory Visit (HOSPITAL_BASED_OUTPATIENT_CLINIC_OR_DEPARTMENT_OTHER)
Admit: 2017-11-30 | Discharge: 2017-11-30 | Disposition: A | Discharge status: Home / Self Care | Payer: Self-pay | Attending: Emergency Medicine | Admitting: Emergency Medicine

## 2017-11-30 ENCOUNTER — Emergency Department (HOSPITAL_BASED_OUTPATIENT_CLINIC_OR_DEPARTMENT_OTHER)
Admission: EM | Admit: 2017-11-30 | Discharge: 2017-11-30 | Disposition: A | Discharge status: Home / Self Care | Payer: Self-pay | Attending: Emergency Medicine | Admitting: Emergency Medicine

## 2017-11-30 DIAGNOSIS — R102 Pelvic and perineal pain: Secondary | ICD-10-CM

## 2017-11-30 DIAGNOSIS — D251 Intramural leiomyoma of uterus: Secondary | ICD-10-CM | POA: Insufficient documentation

## 2017-11-30 LAB — WET PREP, GENITAL
SPERM: NONE SEEN
Trich, Wet Prep: NONE SEEN
Yeast Wet Prep HPF POC: NONE SEEN

## 2017-11-30 LAB — COMPREHENSIVE METABOLIC PANEL
ALBUMIN: 3.5 g/dL (ref 3.5–5.0)
ALK PHOS: 38 U/L (ref 38–126)
ALT: 13 U/L — AB (ref 14–54)
AST: 18 U/L (ref 15–41)
Anion gap: 6 (ref 5–15)
BILIRUBIN TOTAL: 0.5 mg/dL (ref 0.3–1.2)
BUN: 12 mg/dL (ref 6–20)
CO2: 23 mmol/L (ref 22–32)
Calcium: 8.8 mg/dL — ABNORMAL LOW (ref 8.9–10.3)
Chloride: 108 mmol/L (ref 101–111)
Creatinine, Ser: 0.8 mg/dL (ref 0.44–1.00)
GFR calc Af Amer: >60 mL/min (ref >60)
GFR calc non Af Amer: >60 mL/min (ref >60)
GLUCOSE: 78 mg/dL (ref 65–99)
Potassium: 3.6 mmol/L (ref 3.5–5.1)
Sodium: 137 mmol/L (ref 135–145)
TOTAL PROTEIN: 7.6 g/dL (ref 6.5–8.1)

## 2017-11-30 LAB — CBC WITH DIFFERENTIAL/PLATELET
BASOS ABS: 0 10*3/uL (ref 0.0–0.1)
BASOS PCT: 0 %
Eosinophils Absolute: 0.1 10*3/uL (ref 0.0–0.7)
Eosinophils Relative: 2 %
HEMATOCRIT: 32.5 % — AB (ref 36.0–46.0)
HEMOGLOBIN: 10.1 g/dL — AB (ref 12.0–15.0)
Lymphocytes Relative: 59 %
Lymphs Abs: 3.2 10*3/uL (ref 0.7–4.0)
MCH: 25.2 pg — ABNORMAL LOW (ref 26.0–34.0)
MCHC: 31.1 g/dL (ref 30.0–36.0)
MCV: 81 fL (ref 78.0–100.0)
Monocytes Absolute: 0.7 10*3/uL (ref 0.1–1.0)
Monocytes Relative: 12 %
NEUTROS ABS: 1.5 10*3/uL — AB (ref 1.7–7.7)
NEUTROS PCT: 27 %
Platelets: 185 10*3/uL (ref 150–400)
RBC: 4.01 MIL/uL (ref 3.87–5.11)
RDW: 15.7 % — ABNORMAL HIGH (ref 11.5–15.5)
WBC: 5.5 10*3/uL (ref 4.0–10.5)

## 2017-11-30 LAB — LIPASE, BLOOD: Lipase: 25 U/L (ref 11–51)

## 2017-11-30 MED ORDER — OXYCODONE-ACETAMINOPHEN 5-325 MG PO TABS: 1.0000 | ORAL_TABLET | Freq: Four times a day (QID) | ORAL | 0 refills | Status: DC | PRN

## 2017-11-30 MED ORDER — NAPROXEN 500 MG PO TABS: 500.0000 mg | ORAL_TABLET | Freq: Two times a day (BID) | ORAL | 0 refills | Status: DC

## 2017-11-30 MED ORDER — ONDANSETRON 4 MG PO TBDP
4.0000 mg | ORAL_TABLET | Freq: Once | ORAL | Status: AC
  Administered 2017-11-30: 4 mg via ORAL
  Filled 2017-11-30: qty 1

## 2017-11-30 MED ORDER — OXYCODONE-ACETAMINOPHEN 5-325 MG PO TABS
1.0000 | ORAL_TABLET | Freq: Once | ORAL | Status: DC
  Filled 2017-11-30: qty 1

## 2017-11-30 MED ORDER — NAPROXEN 250 MG PO TABS
500.0000 mg | ORAL_TABLET | Freq: Once | ORAL | Status: AC
  Administered 2017-11-30: 500 mg via ORAL
  Filled 2017-11-30: qty 2

## 2017-11-30 MED ORDER — MORPHINE SULFATE (PF) 4 MG/ML IV SOLN
4.0000 mg | Freq: Once | INTRAVENOUS | Status: AC
  Administered 2017-11-30: 4 mg via INTRAVENOUS
  Filled 2017-11-30: qty 1

## 2017-11-30 MED ORDER — HYDROCODONE-ACETAMINOPHEN 5-325 MG PO TABS
1.0000 | ORAL_TABLET | Freq: Once | ORAL | Status: AC
Start: 2017-11-30 — End: 2017-11-30
  Administered 2017-11-30: 1 via ORAL
  Filled 2017-11-30: qty 1
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
## 2017-11-30 NOTE — ED Notes (Signed)
Pt c/o constant, sharp abd pain onset Friday when she started her period. Hx similar episodes before she had her son, but they resolved for awhile. This pain feels similar. Radiates to lower back. Denies other s/s.

## 2017-11-30 NOTE — Discharge Instructions (Signed)
Were seen today for pelvic pain.  This may be related to dysmenorrhea and your period.  Return later today for ultrasound.

## 2017-12-01 LAB — GC/CHLAMYDIA PROBE AMP (~~LOC~~) NOT AT ARMC
CHLAMYDIA, DNA PROBE: NEGATIVE
Neisseria Gonorrhea: NEGATIVE

## 2018-05-09 ENCOUNTER — Other Ambulatory Visit: Payer: Self-pay

## 2018-05-09 ENCOUNTER — Emergency Department (HOSPITAL_BASED_OUTPATIENT_CLINIC_OR_DEPARTMENT_OTHER)
Admission: EM | Admit: 2018-05-09 | Discharge: 2018-05-10 | Disposition: A | Discharge status: Home / Self Care | Payer: No Typology Code available for payment source | Attending: Emergency Medicine | Admitting: Emergency Medicine

## 2018-05-09 ENCOUNTER — Other Ambulatory Visit (HOSPITAL_BASED_OUTPATIENT_CLINIC_OR_DEPARTMENT_OTHER): Payer: Self-pay | Admitting: Radiology

## 2018-05-09 ENCOUNTER — Encounter (HOSPITAL_BASED_OUTPATIENT_CLINIC_OR_DEPARTMENT_OTHER): Payer: Self-pay | Admitting: Emergency Medicine

## 2018-05-09 DIAGNOSIS — S161XXA Strain of muscle, fascia and tendon at neck level, initial encounter: Secondary | ICD-10-CM | POA: Diagnosis not present

## 2018-05-09 DIAGNOSIS — B20 Human immunodeficiency virus [HIV] disease: Secondary | ICD-10-CM | POA: Diagnosis not present

## 2018-05-09 DIAGNOSIS — Y92410 Unspecified street and highway as the place of occurrence of the external cause: Secondary | ICD-10-CM | POA: Insufficient documentation

## 2018-05-09 DIAGNOSIS — Y9389 Activity, other specified: Secondary | ICD-10-CM | POA: Diagnosis not present

## 2018-05-09 DIAGNOSIS — S39012A Strain of muscle, fascia and tendon of lower back, initial encounter: Secondary | ICD-10-CM | POA: Diagnosis not present

## 2018-05-09 DIAGNOSIS — Y998 Other external cause status: Secondary | ICD-10-CM | POA: Insufficient documentation

## 2018-05-09 DIAGNOSIS — S29012A Strain of muscle and tendon of back wall of thorax, initial encounter: Secondary | ICD-10-CM | POA: Diagnosis not present

## 2018-05-09 DIAGNOSIS — S199XXA Unspecified injury of neck, initial encounter: Secondary | ICD-10-CM | POA: Diagnosis present

## 2018-05-09 MED ORDER — HYDROCODONE-ACETAMINOPHEN 5-325 MG PO TABS
1.0000 | ORAL_TABLET | Freq: Once | ORAL | Status: AC
Start: 2018-05-10 — End: 2018-05-10
  Administered 2018-05-10: 1 via ORAL
  Filled 2018-05-09: qty 1
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
## 2018-05-09 NOTE — ED Notes (Signed)
ED Provider at bedside. 

## 2018-05-09 NOTE — ED Notes (Signed)
Pt given ice pack
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
## 2018-05-10 ENCOUNTER — Emergency Department (HOSPITAL_BASED_OUTPATIENT_CLINIC_OR_DEPARTMENT_OTHER): Payer: No Typology Code available for payment source

## 2018-05-10 LAB — PREGNANCY, URINE: Preg Test, Ur: NEGATIVE

## 2018-05-10 MED ORDER — ONDANSETRON 4 MG PO TBDP
4.0000 mg | ORAL_TABLET | Freq: Once | ORAL | Status: AC
  Administered 2018-05-10: 4 mg via ORAL
  Filled 2018-05-10: qty 1

## 2018-05-10 MED ORDER — CYCLOBENZAPRINE HCL 10 MG PO TABS
10.0000 mg | ORAL_TABLET | Freq: Once | ORAL | Status: AC
Start: 2018-05-10 — End: 2018-05-10
  Administered 2018-05-10: 10 mg via ORAL
  Filled 2018-05-10: qty 1

## 2018-05-10 MED ORDER — HYDROCODONE-ACETAMINOPHEN 5-325 MG PO TABS: 1.0000 | ORAL_TABLET | Freq: Four times a day (QID) | ORAL | 0 refills | Status: DC | PRN

## 2018-05-10 MED ORDER — CYCLOBENZAPRINE HCL 10 MG PO TABS: 10.0000 mg | ORAL_TABLET | Freq: Three times a day (TID) | ORAL | 0 refills | Status: DC | PRN

## 2018-05-10 NOTE — ED Notes (Signed)
Returned from xray

## 2018-05-10 NOTE — ED Notes (Signed)
Patient transported to X-ray 

## 2018-05-18 ENCOUNTER — Encounter (HOSPITAL_BASED_OUTPATIENT_CLINIC_OR_DEPARTMENT_OTHER): Payer: Self-pay

## 2018-05-18 ENCOUNTER — Emergency Department (HOSPITAL_BASED_OUTPATIENT_CLINIC_OR_DEPARTMENT_OTHER)
Admission: EM | Admit: 2018-05-18 | Discharge: 2018-05-18 | Disposition: A | Discharge status: Home / Self Care | Payer: No Typology Code available for payment source | Attending: Emergency Medicine | Admitting: Emergency Medicine

## 2018-05-18 ENCOUNTER — Other Ambulatory Visit: Payer: Self-pay

## 2018-05-18 DIAGNOSIS — B20 Human immunodeficiency virus [HIV] disease: Secondary | ICD-10-CM | POA: Diagnosis not present

## 2018-05-18 DIAGNOSIS — S39012D Strain of muscle, fascia and tendon of lower back, subsequent encounter: Secondary | ICD-10-CM | POA: Insufficient documentation

## 2018-05-18 DIAGNOSIS — M545 Low back pain: Secondary | ICD-10-CM | POA: Diagnosis present

## 2018-05-18 DIAGNOSIS — Z79899 Other long term (current) drug therapy: Secondary | ICD-10-CM | POA: Diagnosis not present

## 2018-05-18 MED ORDER — IBUPROFEN 800 MG PO TABS
800.0000 mg | ORAL_TABLET | Freq: Once | ORAL | Status: AC
  Administered 2018-05-18: 800 mg via ORAL
  Filled 2018-05-18: qty 1

## 2018-05-18 MED ORDER — METHOCARBAMOL 1000 MG/10ML IJ SOLN
1000.0000 mg | Freq: Once | INTRAMUSCULAR | Status: AC
  Administered 2018-05-18: 1000 mg via INTRAMUSCULAR
  Filled 2018-05-18: qty 10

## 2018-05-18 MED FILL — HYDROCODON-APAP 5-325: 5-325 | 5 days supply | Qty: 20 | Fill #0

## 2018-05-18 MED FILL — CYCLOBENZAPRINE HCL 10 MG T: 10 | 7 days supply | Qty: 20 | Fill #0
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
## 2018-09-09 ENCOUNTER — Encounter (HOSPITAL_BASED_OUTPATIENT_CLINIC_OR_DEPARTMENT_OTHER): Payer: Self-pay | Admitting: Emergency Medicine

## 2018-09-09 ENCOUNTER — Other Ambulatory Visit: Payer: Self-pay

## 2018-09-09 ENCOUNTER — Emergency Department (HOSPITAL_BASED_OUTPATIENT_CLINIC_OR_DEPARTMENT_OTHER)
Admission: EM | Admit: 2018-09-09 | Discharge: 2018-09-09 | Disposition: A | Discharge status: Home / Self Care | Payer: No Typology Code available for payment source | Attending: Emergency Medicine | Admitting: Emergency Medicine

## 2018-09-09 DIAGNOSIS — B20 Human immunodeficiency virus [HIV] disease: Secondary | ICD-10-CM | POA: Insufficient documentation

## 2018-09-09 DIAGNOSIS — M545 Low back pain, unspecified: Secondary | ICD-10-CM

## 2018-09-09 DIAGNOSIS — M79604 Pain in right leg: Secondary | ICD-10-CM | POA: Diagnosis not present

## 2018-09-09 MED ORDER — ONDANSETRON 4 MG PO TBDP
4.0000 mg | ORAL_TABLET | Freq: Once | ORAL | Status: AC
  Administered 2018-09-09: 4 mg via ORAL
  Filled 2018-09-09: qty 1

## 2018-09-09 MED ORDER — HYDROMORPHONE HCL 1 MG/ML IJ SOLN
1.0000 mg | Freq: Once | INTRAMUSCULAR | Status: AC
  Administered 2018-09-09: 1 mg via INTRAMUSCULAR
  Filled 2018-09-09: qty 1

## 2018-09-09 MED ORDER — PREDNISONE 20 MG PO TABS: ORAL_TABLET | ORAL | 0 refills | Status: DC

## 2018-09-09 MED ORDER — METHOCARBAMOL 500 MG PO TABS
500.0000 mg | ORAL_TABLET | Freq: Once | ORAL | Status: AC
  Administered 2018-09-09: 500 mg via ORAL
  Filled 2018-09-09: qty 1

## 2018-09-09 MED ORDER — PREDNISONE 50 MG PO TABS
60.0000 mg | ORAL_TABLET | Freq: Once | ORAL | Status: AC
  Administered 2018-09-09: 60 mg via ORAL
  Filled 2018-09-09: qty 1

## 2018-09-09 MED ORDER — IBUPROFEN 800 MG PO TABS
800.0000 mg | ORAL_TABLET | Freq: Once | ORAL | Status: AC
  Administered 2018-09-09: 800 mg via ORAL
  Filled 2018-09-09: qty 1
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
## 2019-03-19 ENCOUNTER — Emergency Department (HOSPITAL_BASED_OUTPATIENT_CLINIC_OR_DEPARTMENT_OTHER)
Admission: EM | Admit: 2019-03-19 | Discharge: 2019-03-19 | Disposition: A | Discharge status: Home / Self Care | Payer: No Typology Code available for payment source | Attending: Emergency Medicine | Admitting: Emergency Medicine

## 2019-03-19 ENCOUNTER — Other Ambulatory Visit: Payer: Self-pay

## 2019-03-19 ENCOUNTER — Encounter (HOSPITAL_BASED_OUTPATIENT_CLINIC_OR_DEPARTMENT_OTHER): Payer: Self-pay | Admitting: *Deleted

## 2019-03-19 DIAGNOSIS — Y9289 Other specified places as the place of occurrence of the external cause: Secondary | ICD-10-CM | POA: Diagnosis not present

## 2019-03-19 DIAGNOSIS — S7011XA Contusion of right thigh, initial encounter: Secondary | ICD-10-CM | POA: Insufficient documentation

## 2019-03-19 DIAGNOSIS — S161XXA Strain of muscle, fascia and tendon at neck level, initial encounter: Secondary | ICD-10-CM | POA: Insufficient documentation

## 2019-03-19 DIAGNOSIS — Y9389 Activity, other specified: Secondary | ICD-10-CM | POA: Insufficient documentation

## 2019-03-19 DIAGNOSIS — S39012A Strain of muscle, fascia and tendon of lower back, initial encounter: Secondary | ICD-10-CM

## 2019-03-19 DIAGNOSIS — Y999 Unspecified external cause status: Secondary | ICD-10-CM | POA: Insufficient documentation

## 2019-03-19 DIAGNOSIS — S199XXA Unspecified injury of neck, initial encounter: Secondary | ICD-10-CM | POA: Diagnosis present

## 2019-03-19 DIAGNOSIS — Z79899 Other long term (current) drug therapy: Secondary | ICD-10-CM | POA: Insufficient documentation

## 2019-03-19 DIAGNOSIS — Z21 Asymptomatic human immunodeficiency virus [HIV] infection status: Secondary | ICD-10-CM | POA: Insufficient documentation

## 2019-03-19 MED ORDER — IBUPROFEN 400 MG PO TABS
600.0000 mg | ORAL_TABLET | Freq: Once | ORAL | Status: AC
  Administered 2019-03-19: 600 mg via ORAL
  Filled 2019-03-19: qty 1

## 2019-03-19 NOTE — ED Notes (Signed)
Pt verbalized understanding of dc instructions.
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
## 2019-03-19 NOTE — ED Notes (Signed)
ED Provider at bedside. 

## 2019-03-19 NOTE — Discharge Instructions (Signed)
If you develop worsening, recurrent, or continued neck or back pain, numbness or weakness in the arms or legs, incontinence of your bowels or bladders, numbness of your buttocks, fever, abdominal pain, or any other new/concerning symptoms then return to the ER for evaluation.
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
## 2019-04-01 ENCOUNTER — Other Ambulatory Visit (HOSPITAL_BASED_OUTPATIENT_CLINIC_OR_DEPARTMENT_OTHER): Payer: Self-pay | Admitting: Orthopaedic Surgery

## 2019-04-01 ENCOUNTER — Ambulatory Visit (HOSPITAL_BASED_OUTPATIENT_CLINIC_OR_DEPARTMENT_OTHER)
Admission: RE | Admit: 2019-04-01 | Discharge: 2019-04-01 | Disposition: A | Discharge status: Home / Self Care | Payer: No Typology Code available for payment source | Source: Ambulatory Visit | Attending: Orthopaedic Surgery | Admitting: Orthopaedic Surgery

## 2019-04-01 ENCOUNTER — Other Ambulatory Visit: Payer: Self-pay

## 2019-04-01 DIAGNOSIS — G5602 Carpal tunnel syndrome, left upper limb: Secondary | ICD-10-CM

## 2019-04-01 DIAGNOSIS — M5412 Radiculopathy, cervical region: Secondary | ICD-10-CM | POA: Diagnosis present

## 2019-04-01 DIAGNOSIS — M5416 Radiculopathy, lumbar region: Secondary | ICD-10-CM | POA: Diagnosis not present

## 2019-04-01 DIAGNOSIS — G5601 Carpal tunnel syndrome, right upper limb: Secondary | ICD-10-CM | POA: Diagnosis present

## 2019-11-30 ENCOUNTER — Other Ambulatory Visit: Payer: Self-pay

## 2019-11-30 DIAGNOSIS — Z20822 Contact with and (suspected) exposure to covid-19: Secondary | ICD-10-CM

## 2019-12-01 LAB — NOVEL CORONAVIRUS, NAA: SARS-CoV-2, NAA: NOT DETECTED

## 2020-04-09 ENCOUNTER — Other Ambulatory Visit: Payer: Self-pay

## 2020-04-09 ENCOUNTER — Emergency Department (HOSPITAL_BASED_OUTPATIENT_CLINIC_OR_DEPARTMENT_OTHER)
Admission: EM | Admit: 2020-04-09 | Discharge: 2020-04-09 | Disposition: A | Discharge status: Home / Self Care | Payer: BC Managed Care – PPO | Attending: Emergency Medicine | Admitting: Emergency Medicine

## 2020-04-09 ENCOUNTER — Emergency Department (HOSPITAL_BASED_OUTPATIENT_CLINIC_OR_DEPARTMENT_OTHER): Payer: BC Managed Care – PPO

## 2020-04-09 ENCOUNTER — Encounter (HOSPITAL_BASED_OUTPATIENT_CLINIC_OR_DEPARTMENT_OTHER): Payer: Self-pay | Admitting: *Deleted

## 2020-04-09 DIAGNOSIS — Z79899 Other long term (current) drug therapy: Secondary | ICD-10-CM | POA: Diagnosis not present

## 2020-04-09 DIAGNOSIS — R0602 Shortness of breath: Secondary | ICD-10-CM | POA: Insufficient documentation

## 2020-04-09 DIAGNOSIS — R05 Cough: Secondary | ICD-10-CM | POA: Diagnosis present

## 2020-04-09 DIAGNOSIS — J209 Acute bronchitis, unspecified: Secondary | ICD-10-CM | POA: Insufficient documentation

## 2020-04-09 DIAGNOSIS — Z20822 Contact with and (suspected) exposure to covid-19: Secondary | ICD-10-CM | POA: Diagnosis not present

## 2020-04-09 DIAGNOSIS — Z21 Asymptomatic human immunodeficiency virus [HIV] infection status: Secondary | ICD-10-CM | POA: Insufficient documentation

## 2020-04-09 LAB — SARS CORONAVIRUS 2 BY RT PCR (HOSPITAL ORDER, PERFORMED IN ~~LOC~~ HOSPITAL LAB): SARS Coronavirus 2: NEGATIVE

## 2020-04-09 MED ORDER — ALBUTEROL SULFATE HFA 108 (90 BASE) MCG/ACT IN AERS
2.0000 | INHALATION_SPRAY | Freq: Once | RESPIRATORY_TRACT | Status: AC
  Administered 2020-04-09: 2 via RESPIRATORY_TRACT
  Filled 2020-04-09: qty 6.7

## 2020-04-09 MED ORDER — AZITHROMYCIN 250 MG PO TABS
500.0000 mg | ORAL_TABLET | Freq: Once | ORAL | Status: AC
  Administered 2020-04-09: 500 mg via ORAL
  Filled 2020-04-09: qty 2

## 2020-04-09 MED ORDER — DEXAMETHASONE SODIUM PHOSPHATE 10 MG/ML IJ SOLN
10.0000 mg | Freq: Once | INTRAMUSCULAR | Status: AC
  Administered 2020-04-09: 10 mg via INTRAMUSCULAR
  Filled 2020-04-09: qty 1

## 2020-04-09 MED ORDER — AEROCHAMBER PLUS FLO-VU MEDIUM MISC
1.0000 | Freq: Once | Status: AC
  Administered 2020-04-09: 1
  Filled 2020-04-09: qty 1
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
## 2020-04-09 NOTE — ED Triage Notes (Signed)
She was seen at [facility] and given 2 Rx. States she found she will have to pay for the medication and came here.

## 2020-04-10 MED FILL — VENTOLIN HFA 90 MCG INHALER: 108 (90 BAS | 25 days supply | Qty: 18 | Fill #0

## 2020-04-10 MED FILL — AZITHROMYCIN 250 MG TABLET: 250 | 5 days supply | Qty: 6 | Fill #0

## 2020-09-02 ENCOUNTER — Encounter (HOSPITAL_BASED_OUTPATIENT_CLINIC_OR_DEPARTMENT_OTHER): Payer: Self-pay | Admitting: *Deleted

## 2020-09-02 ENCOUNTER — Emergency Department (HOSPITAL_BASED_OUTPATIENT_CLINIC_OR_DEPARTMENT_OTHER)
Admission: EM | Admit: 2020-09-02 | Discharge: 2020-09-02 | Disposition: A | Discharge status: Home / Self Care | Payer: BC Managed Care – PPO | Attending: Emergency Medicine | Admitting: Emergency Medicine

## 2020-09-02 ENCOUNTER — Emergency Department (HOSPITAL_BASED_OUTPATIENT_CLINIC_OR_DEPARTMENT_OTHER): Payer: BC Managed Care – PPO

## 2020-09-02 ENCOUNTER — Other Ambulatory Visit: Payer: Self-pay

## 2020-09-02 DIAGNOSIS — R059 Cough, unspecified: Secondary | ICD-10-CM | POA: Diagnosis present

## 2020-09-02 DIAGNOSIS — R0602 Shortness of breath: Secondary | ICD-10-CM | POA: Insufficient documentation

## 2020-09-02 MED ORDER — PREDNISONE 20 MG PO TABS: 40.0000 mg | ORAL_TABLET | Freq: Every day | ORAL | 0 refills | Status: AC

## 2020-09-02 MED ORDER — DEXAMETHASONE SODIUM PHOSPHATE 10 MG/ML IJ SOLN
10.0000 mg | Freq: Once | INTRAMUSCULAR | Status: AC
  Administered 2020-09-02: 10 mg via INTRAMUSCULAR
  Filled 2020-09-02: qty 1

## 2020-09-02 MED ORDER — HYDROCODONE-ACETAMINOPHEN 5-325 MG PO TABS
1.0000 | ORAL_TABLET | Freq: Once | ORAL | Status: AC
  Administered 2020-09-02: 1 via ORAL
  Filled 2020-09-02: qty 1

## 2020-09-02 MED ORDER — ALBUTEROL SULFATE HFA 108 (90 BASE) MCG/ACT IN AERS
1.0000 | INHALATION_SPRAY | Freq: Once | RESPIRATORY_TRACT | Status: AC
  Administered 2020-09-02: 2 via RESPIRATORY_TRACT
  Filled 2020-09-02: qty 6.7
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
## 2020-09-02 NOTE — ED Triage Notes (Signed)
Cough after using cleaning products yesterday. HR 116, O2 sats 98% on room air

## 2020-09-02 NOTE — ED Triage Notes (Signed)
RT to triage to assess pt

## 2020-09-02 NOTE — Progress Notes (Signed)
Patient ambulated around the department while on pulse ox.  Patient's SPO2 remained between 98% and and 100% and her heart rate was between 103 and 113.  Patient stated she was a "little" short of breath when returning to her room.

## 2020-09-02 NOTE — Discharge Instructions (Signed)
As we discussed, your chest x-ray today looked reassuring.  This is most likely irritated from cleaning yesterday.  Use inhaler as directed.  Take prednisone as directed.  Return emergency department any worsening difficulty breathing, vomiting or new worsening concerning symptoms.

## 2020-09-02 NOTE — ED Triage Notes (Signed)
Pt reports she cleaned with chemicals yesterday. Cough, SOB, laryngitis today

## 2020-09-04 ENCOUNTER — Other Ambulatory Visit: Payer: Self-pay

## 2020-09-04 ENCOUNTER — Encounter (HOSPITAL_BASED_OUTPATIENT_CLINIC_OR_DEPARTMENT_OTHER): Payer: Self-pay | Admitting: *Deleted

## 2020-09-04 DIAGNOSIS — R059 Cough, unspecified: Secondary | ICD-10-CM | POA: Diagnosis not present

## 2020-09-04 DIAGNOSIS — R0602 Shortness of breath: Secondary | ICD-10-CM | POA: Insufficient documentation
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
## 2020-09-05 ENCOUNTER — Emergency Department (HOSPITAL_BASED_OUTPATIENT_CLINIC_OR_DEPARTMENT_OTHER)
Admission: EM | Admit: 2020-09-05 | Discharge: 2020-09-05 | Disposition: A | Discharge status: Home / Self Care | Payer: BC Managed Care – PPO | Attending: Emergency Medicine | Admitting: Emergency Medicine

## 2020-09-05 DIAGNOSIS — R059 Cough, unspecified: Secondary | ICD-10-CM

## 2020-09-05 MED ORDER — IPRATROPIUM-ALBUTEROL 20-100 MCG/ACT IN AERS
2.0000 | INHALATION_SPRAY | Freq: Once | RESPIRATORY_TRACT | Status: AC
  Administered 2020-09-05: 2 via RESPIRATORY_TRACT
  Filled 2020-09-05: qty 4

## 2020-09-05 MED ORDER — ALBUTEROL SULFATE HFA 108 (90 BASE) MCG/ACT IN AERS: 6.0000 | INHALATION_SPRAY | Freq: Once | RESPIRATORY_TRACT | Status: DC

## 2020-09-05 MED ORDER — IPRATROPIUM BROMIDE HFA 17 MCG/ACT IN AERS: 2.0000 | INHALATION_SPRAY | Freq: Once | RESPIRATORY_TRACT | Status: DC

## 2020-09-05 MED ORDER — BENZONATATE 100 MG PO CAPS
100.0000 mg | ORAL_CAPSULE | Freq: Once | ORAL | Status: AC
  Administered 2020-09-05: 100 mg via ORAL
  Filled 2020-09-05: qty 1

## 2020-09-05 MED ORDER — AEROCHAMBER PLUS FLO-VU LARGE MISC
1.0000 | Freq: Once | Status: AC
  Administered 2020-09-05: 1
  Filled 2020-09-05: qty 1

## 2020-09-05 MED ORDER — LIDOCAINE HCL (PF) 1 % IJ SOLN
5.0000 mL | Freq: Once | INTRAMUSCULAR | Status: AC
  Administered 2020-09-05: 5 mL
  Filled 2020-09-05: qty 5
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
## 2020-10-29 ENCOUNTER — Emergency Department (HOSPITAL_BASED_OUTPATIENT_CLINIC_OR_DEPARTMENT_OTHER)
Admission: EM | Admit: 2020-10-29 | Discharge: 2020-10-30 | Disposition: A | Discharge status: Home / Self Care | Payer: Medicaid Other | Attending: Emergency Medicine | Admitting: Emergency Medicine

## 2020-10-29 ENCOUNTER — Other Ambulatory Visit: Payer: Self-pay

## 2020-10-29 ENCOUNTER — Encounter (HOSPITAL_BASED_OUTPATIENT_CLINIC_OR_DEPARTMENT_OTHER): Payer: Self-pay | Admitting: *Deleted

## 2020-10-29 DIAGNOSIS — R1013 Epigastric pain: Secondary | ICD-10-CM | POA: Diagnosis present

## 2020-10-29 DIAGNOSIS — R112 Nausea with vomiting, unspecified: Secondary | ICD-10-CM | POA: Diagnosis not present

## 2020-10-29 DIAGNOSIS — Z87442 Personal history of urinary calculi: Secondary | ICD-10-CM | POA: Diagnosis not present

## 2020-10-29 DIAGNOSIS — Z21 Asymptomatic human immunodeficiency virus [HIV] infection status: Secondary | ICD-10-CM | POA: Diagnosis not present

## 2020-10-29 DIAGNOSIS — R1084 Generalized abdominal pain: Secondary | ICD-10-CM | POA: Insufficient documentation

## 2020-10-29 LAB — URINALYSIS, ROUTINE W REFLEX MICROSCOPIC
Glucose, UA: NEGATIVE mg/dL
Hgb urine dipstick: NEGATIVE
Ketones, ur: 15 mg/dL — AB
Leukocytes,Ua: NEGATIVE
Nitrite: NEGATIVE
Protein, ur: NEGATIVE mg/dL
Specific Gravity, Urine: >1.03 — ABNORMAL HIGH (ref 1.005–1.030)
pH: 5.5 (ref 5.0–8.0)

## 2020-10-29 LAB — COMPREHENSIVE METABOLIC PANEL
ALT: 15 U/L (ref 0–44)
AST: 23 U/L (ref 15–41)
Albumin: 4.3 g/dL (ref 3.5–5.0)
Alkaline Phosphatase: 46 U/L (ref 38–126)
Anion gap: 8 (ref 5–15)
BUN: 8 mg/dL (ref 6–20)
CO2: 23 mmol/L (ref 22–32)
Calcium: 9.5 mg/dL (ref 8.9–10.3)
Chloride: 104 mmol/L (ref 98–111)
Creatinine, Ser: 0.82 mg/dL (ref 0.44–1.00)
GFR, Estimated: >60 mL/min (ref >60)
Glucose, Bld: 100 mg/dL — ABNORMAL HIGH (ref 70–99)
Potassium: 3.5 mmol/L (ref 3.5–5.1)
Sodium: 135 mmol/L (ref 135–145)
Total Bilirubin: 0.6 mg/dL (ref 0.3–1.2)
Total Protein: 8.5 g/dL — ABNORMAL HIGH (ref 6.5–8.1)

## 2020-10-29 LAB — CBC WITH DIFFERENTIAL/PLATELET
Abs Immature Granulocytes: 0.01 10*3/uL (ref 0.00–0.07)
Basophils Absolute: 0.1 10*3/uL (ref 0.0–0.1)
Basophils Relative: 1 %
Eosinophils Absolute: 0 10*3/uL (ref 0.0–0.5)
Eosinophils Relative: 0 %
HCT: 38.9 % (ref 36.0–46.0)
Hemoglobin: 12.7 g/dL (ref 12.0–15.0)
Immature Granulocytes: 0 %
Lymphocytes Relative: 14 %
Lymphs Abs: 1.1 10*3/uL (ref 0.7–4.0)
MCH: 27 pg (ref 26.0–34.0)
MCHC: 32.6 g/dL (ref 30.0–36.0)
MCV: 82.8 fL (ref 80.0–100.0)
Monocytes Absolute: 0.2 10*3/uL (ref 0.1–1.0)
Monocytes Relative: 3 %
Neutro Abs: 6.6 10*3/uL (ref 1.7–7.7)
Neutrophils Relative %: 82 %
Platelets: 332 10*3/uL (ref 150–400)
RBC: 4.7 MIL/uL (ref 3.87–5.11)
RDW: 14.6 % (ref 11.5–15.5)
WBC: 8 10*3/uL (ref 4.0–10.5)
nRBC: 0 % (ref 0.0–0.2)

## 2020-10-29 LAB — URINALYSIS, MICROSCOPIC (REFLEX)

## 2020-10-29 LAB — LIPASE, BLOOD: Lipase: 18 U/L (ref 11–51)

## 2020-10-29 LAB — PREGNANCY, URINE: Preg Test, Ur: NEGATIVE

## 2020-10-29 NOTE — ED Notes (Signed)
Abdominal pain onset 2 weeks ago, this am awoke with abd pain, has progressed throughout today, also having N/V and D. Last vomiting episode was 2 hours ago. States has up to 4 to 5 watery stools as well today. Has not eaten today. Tried to drink and developed a burning sensation when swallowing.

## 2020-10-29 NOTE — ED Triage Notes (Signed)
C/o abd pain with n/v/d x 2 weeks

## 2020-10-30 ENCOUNTER — Emergency Department (HOSPITAL_BASED_OUTPATIENT_CLINIC_OR_DEPARTMENT_OTHER): Payer: Medicaid Other

## 2020-10-30 MED ORDER — SODIUM CHLORIDE 0.9 % IV BOLUS
1000.0000 mL | Freq: Once | INTRAVENOUS | Status: AC
  Administered 2020-10-30: 1000 mL via INTRAVENOUS

## 2020-10-30 MED ORDER — MORPHINE SULFATE (PF) 4 MG/ML IV SOLN
4.0000 mg | Freq: Once | INTRAVENOUS | Status: AC
  Administered 2020-10-30: 4 mg via INTRAVENOUS
  Filled 2020-10-30: qty 1

## 2020-10-30 MED ORDER — ONDANSETRON HCL 4 MG/2ML IJ SOLN
4.0000 mg | Freq: Once | INTRAMUSCULAR | Status: AC
  Administered 2020-10-30: 4 mg via INTRAVENOUS
  Filled 2020-10-30: qty 2

## 2020-10-30 MED ORDER — ONDANSETRON 4 MG PO TBDP: ORAL_TABLET | ORAL | 0 refills | Status: DC

## 2020-10-30 MED ORDER — IOHEXOL 300 MG/ML  SOLN
100.0000 mL | Freq: Once | INTRAMUSCULAR | Status: AC | PRN
  Administered 2020-10-30: 100 mL via INTRAVENOUS

## 2020-10-30 MED ORDER — ONDANSETRON 4 MG PO TBDP: ORAL_TABLET | ORAL | 0 refills | Status: AC

## 2020-10-30 NOTE — ED Notes (Signed)
Pt informed to remain NPO until further orders, also instructed not to get off stretcher without staff in room, remains on cont POX monitoring with int NBP assessments

## 2020-10-30 NOTE — Discharge Instructions (Signed)
Follow up with your PCP.  Return for inability to eat or drink, persistent fever >5 days, sudden worsening abdominal pain.  You can take imodium for diarrhea.
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
## 2021-08-24 ENCOUNTER — Emergency Department (HOSPITAL_BASED_OUTPATIENT_CLINIC_OR_DEPARTMENT_OTHER): Payer: Medicaid Other

## 2021-08-24 ENCOUNTER — Other Ambulatory Visit: Payer: Self-pay

## 2021-08-24 ENCOUNTER — Emergency Department (HOSPITAL_BASED_OUTPATIENT_CLINIC_OR_DEPARTMENT_OTHER)
Admission: EM | Admit: 2021-08-24 | Discharge: 2021-08-24 | Disposition: A | Discharge status: Home / Self Care | Payer: Medicaid Other | Attending: Emergency Medicine | Admitting: Emergency Medicine

## 2021-08-24 ENCOUNTER — Encounter (HOSPITAL_BASED_OUTPATIENT_CLINIC_OR_DEPARTMENT_OTHER): Payer: Self-pay

## 2021-08-24 DIAGNOSIS — R42 Dizziness and giddiness: Secondary | ICD-10-CM

## 2021-08-24 DIAGNOSIS — M545 Low back pain, unspecified: Secondary | ICD-10-CM | POA: Diagnosis not present

## 2021-08-24 DIAGNOSIS — R110 Nausea: Secondary | ICD-10-CM | POA: Diagnosis not present

## 2021-08-24 DIAGNOSIS — Z21 Asymptomatic human immunodeficiency virus [HIV] infection status: Secondary | ICD-10-CM | POA: Insufficient documentation

## 2021-08-24 LAB — BASIC METABOLIC PANEL
Anion gap: 7 (ref 5–15)
BUN: 15 mg/dL (ref 6–20)
CO2: 23 mmol/L (ref 22–32)
Calcium: 9.5 mg/dL (ref 8.9–10.3)
Chloride: 105 mmol/L (ref 98–111)
Creatinine, Ser: 1.01 mg/dL — ABNORMAL HIGH (ref 0.44–1.00)
GFR, Estimated: >60 mL/min (ref >60)
Glucose, Bld: 90 mg/dL (ref 70–99)
Potassium: 4 mmol/L (ref 3.5–5.1)
Sodium: 135 mmol/L (ref 135–145)

## 2021-08-24 LAB — URINALYSIS, ROUTINE W REFLEX MICROSCOPIC
Bilirubin Urine: NEGATIVE
Glucose, UA: NEGATIVE mg/dL
Hgb urine dipstick: NEGATIVE
Ketones, ur: NEGATIVE mg/dL
Nitrite: NEGATIVE
Protein, ur: NEGATIVE mg/dL
Specific Gravity, Urine: 1.025 (ref 1.005–1.030)
pH: 5 (ref 5.0–8.0)

## 2021-08-24 LAB — CBC WITH DIFFERENTIAL/PLATELET
Abs Immature Granulocytes: 0.03 10*3/uL (ref 0.00–0.07)
Basophils Absolute: 0.1 10*3/uL (ref 0.0–0.1)
Basophils Relative: 1 %
Eosinophils Absolute: 0.1 10*3/uL (ref 0.0–0.5)
Eosinophils Relative: 2 %
HCT: 39.4 % (ref 36.0–46.0)
Hemoglobin: 13 g/dL (ref 12.0–15.0)
Immature Granulocytes: 0 %
Lymphocytes Relative: 31 %
Lymphs Abs: 2.4 10*3/uL (ref 0.7–4.0)
MCH: 29 pg (ref 26.0–34.0)
MCHC: 33 g/dL (ref 30.0–36.0)
MCV: 87.8 fL (ref 80.0–100.0)
Monocytes Absolute: 0.5 10*3/uL (ref 0.1–1.0)
Monocytes Relative: 6 %
Neutro Abs: 4.7 10*3/uL (ref 1.7–7.7)
Neutrophils Relative %: 60 %
Platelets: 354 10*3/uL (ref 150–400)
RBC: 4.49 MIL/uL (ref 3.87–5.11)
RDW: 13.7 % (ref 11.5–15.5)
WBC: 7.8 10*3/uL (ref 4.0–10.5)
nRBC: 0 % (ref 0.0–0.2)

## 2021-08-24 LAB — URINALYSIS, MICROSCOPIC (REFLEX)

## 2021-08-24 LAB — PREGNANCY, URINE: Preg Test, Ur: NEGATIVE

## 2021-08-24 MED ORDER — SODIUM CHLORIDE 0.9 % IV BOLUS
1000.0000 mL | Freq: Once | INTRAVENOUS | Status: AC
  Administered 2021-08-24: 1000 mL via INTRAVENOUS

## 2021-08-24 MED ORDER — MECLIZINE HCL 25 MG PO TABS
25.0000 mg | ORAL_TABLET | Freq: Once | ORAL | Status: AC
  Administered 2021-08-24: 25 mg via ORAL
  Filled 2021-08-24: qty 1

## 2021-08-24 NOTE — ED Notes (Signed)
Pt is unable to urinate at the time, but she is aware that a urine sample is needed. She was provided with a cup and wipe. Informed if she needed to go, to get the urine sample.

## 2021-08-24 NOTE — ED Triage Notes (Signed)
Pt reports dizziness with nausea since receiving three lumbar interspinous bursa injections yesterday 9/30. Pt denies fall. No hx of vertigo per patient. Referred from UC for CT scan.

## 2021-08-24 NOTE — ED Notes (Signed)
Pt in MRI.
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
## 2022-02-12 ENCOUNTER — Emergency Department (HOSPITAL_BASED_OUTPATIENT_CLINIC_OR_DEPARTMENT_OTHER): Payer: Medicaid Other

## 2022-02-12 ENCOUNTER — Encounter (HOSPITAL_BASED_OUTPATIENT_CLINIC_OR_DEPARTMENT_OTHER): Payer: Self-pay

## 2022-02-12 ENCOUNTER — Emergency Department (HOSPITAL_BASED_OUTPATIENT_CLINIC_OR_DEPARTMENT_OTHER)
Admission: EM | Admit: 2022-02-12 | Discharge: 2022-02-12 | Disposition: A | Discharge status: Home / Self Care | Payer: Medicaid Other | Attending: Emergency Medicine | Admitting: Emergency Medicine

## 2022-02-12 ENCOUNTER — Other Ambulatory Visit: Payer: Self-pay

## 2022-02-12 DIAGNOSIS — Y9301 Activity, walking, marching and hiking: Secondary | ICD-10-CM | POA: Insufficient documentation

## 2022-02-12 DIAGNOSIS — W010XXA Fall on same level from slipping, tripping and stumbling without subsequent striking against object, initial encounter: Secondary | ICD-10-CM | POA: Insufficient documentation

## 2022-02-12 DIAGNOSIS — M25561 Pain in right knee: Secondary | ICD-10-CM | POA: Diagnosis present

## 2022-02-12 MED ORDER — ACETAMINOPHEN 500 MG PO TABS
1000.0000 mg | ORAL_TABLET | Freq: Once | ORAL | Status: AC
  Administered 2022-02-12: 1000 mg via ORAL
  Filled 2022-02-12: qty 2

## 2022-02-12 NOTE — Discharge Instructions (Signed)
You were seen today for right knee pain.  X-rays show no evidence of fracture or dislocation.  I recommend follow-up with your orthopedic provider.  Return to the emergency department if you develop life-threatening condition such as chest pain or shortness of breath ?

## 2022-02-12 NOTE — ED Triage Notes (Signed)
Pt states fell in October and injured right knee. C/o worsening right knee pain x 2 weeks. ?

## 2022-02-12 NOTE — ED Provider Notes (Signed)
?[facility] ?Provider Note ? ? ?CSN: [id number] ?Arrival date & time: 02/12/22  1829 ? ?  ? ?History ? ?Chief Complaint  ?Patient presents with  ? Knee Pain  ? ? ?Robin Orozco is a 46 y.o. female.  Patient presents to the emergency department complaining of right knee pain secondary to a fall that occurred in October.  The patient states that she was walking in a hotel and slipped on a wet floor, landing and injuring her right knee.  She states that she has had pain consistently since the accident but that has been worse over the past 2 weeks.  She has previously seen pain medicine and orthopedics.  She has had nerve ablation surgery in the right knee with no relief.  PMH also significant for HIV positive. ? ?HPI ? ?  ? ?Home Medications ?Prior to Admission medications   ?Medication Sig Start Date End Date Taking? Authorizing Provider  ?bictegravir-emtricitabine-tenofovir AF (BIKTARVY) 50-200-25 MG TABS tablet Take by mouth. 08/30/18   [provider]  ?cyclobenzaprine (FLEXERIL) 10 MG tablet Take 1 tablet (10 mg total) by mouth 3 (three) times daily as needed for muscle spasms. 05/10/18   Molpus, John, MD  ?HYDROcodone-acetaminophen (NORCO) 5-325 MG tablet Take 1 tablet by mouth every 6 (six) hours as needed for severe pain (for eye pain). 05/10/18   Molpus, John, MD  ?ondansetron (ZOFRAN ODT) 4 MG disintegrating tablet '4mg'$  ODT q4 hours prn nausea/vomit 10/30/20   Deno Etienne, DO  ?   ? ?Allergies    ?Ketorolac tromethamine and Aspirin   ? ?Review of Systems   ?Review of Systems  ?Respiratory:  Negative for shortness of breath.   ?Cardiovascular:  Negative for chest pain.  ?Musculoskeletal:  Positive for arthralgias.  ?     Right knee pain  ? ?Physical Exam ?Updated Vital Signs ?BP (!) 172/71 (BP Location: Right Arm)   Pulse (!) 58   Temp 98.2 ?F (36.8 ?C) (Oral)   Resp 16   Ht '5\' 6"'$  (1.676 m)   Wt 89.8 kg   LMP 02/09/2022 (Exact Date)   SpO2 100%   BMI 31.96 kg/m?   ?Physical Exam ?Vitals and nursing note reviewed.  ?Constitutional:   ?   General: She is not in acute distress. ?HENT:  ?   Head: Normocephalic.  ?Cardiovascular:  ?   Rate and Rhythm: Normal rate and regular rhythm.  ?Pulmonary:  ?   Effort: Pulmonary effort is normal.  ?   Breath sounds: Normal breath sounds.  ?Neurological:  ?   Mental Status: She is alert.  ? ? ?ED Results / Procedures / Treatments   ?Labs ?(all labs ordered are listed, but only abnormal results are displayed) ?Labs Reviewed - No data to display ? ?EKG ?None ? ?Radiology ?DG Knee Complete 4 Views Right ? ?Result Date: 02/12/2022 ?CLINICAL DATA:  Right knee pain, fall EXAM: RIGHT KNEE - COMPLETE 4+ VIEW COMPARISON:  None. FINDINGS: No recent fracture or dislocation is seen. There is linear smooth marginated calcification adjacent to the medial margin of medial condyle of distal femur possibly old avulsion or ligament calcification. Small bony spurs seen in the patella. There is no significant effusion. IMPRESSION: No recent fracture or dislocation is seen. Degenerative changes with small bony spurs are seen in the patella. Electronically Signed   By: Elmer Picker M.D.   On: 02/12/2022 19:36   ? ?Procedures ?Procedures  ? ? ?Medications Ordered in ED ?Medications  ?acetaminophen (TYLENOL) tablet  1,000 mg (1,000 mg Oral Given 02/12/22 2009)  ? ? ?ED Course/ Medical Decision Making/ A&P ?  ?                        ?Medical Decision Making ?Amount and/or Complexity of Data Reviewed ?Radiology: ordered. ? ? ?The patient is seen today for right knee pain after a fall.  Differential diagnosis includes but is not limited to fracture, dislocation, ligament injury, tendon injury, arthritis, and others.  I ordered and interpreted plain x-rays of the patient's right knee.  X-ray showed no recent fracture or dislocation.  Degenerative changes with small bony spurs were seen in the patella.  I agree with the radiologist interpretation.  I ordered  Tylenol for the patient's pain.  The patient seem to improve after administration.  I see no indication for further emergent imaging or lab work.  There is no indication for admission.  I believe the patient may discharge home with outpatient orthopedic follow-up.  The patient already has an orthopedic provider which has seen this knee in the past.  Return precautions provided. ? ?Final Clinical Impression(s) / ED Diagnoses ?Final diagnoses:  ?Right knee pain, unspecified chronicity  ? ? ?Rx / DC Orders ?ED Discharge Orders   ? ? None  ? ?  ? ? ?  ?Dorothyann Peng, PA-C ?02/12/22 2019 ? ?  ?Deno Etienne, DO ?02/12/22 2109 ? ?

## 2022-10-06 ENCOUNTER — Encounter (HOSPITAL_BASED_OUTPATIENT_CLINIC_OR_DEPARTMENT_OTHER): Payer: Self-pay

## 2022-10-06 ENCOUNTER — Other Ambulatory Visit: Payer: Self-pay

## 2022-10-06 ENCOUNTER — Emergency Department (HOSPITAL_BASED_OUTPATIENT_CLINIC_OR_DEPARTMENT_OTHER)
Admission: EM | Admit: 2022-10-06 | Discharge: 2022-10-06 | Disposition: A | Discharge status: Home / Self Care | Payer: Medicaid Other | Attending: Emergency Medicine | Admitting: Emergency Medicine

## 2022-10-06 DIAGNOSIS — M545 Low back pain, unspecified: Secondary | ICD-10-CM | POA: Diagnosis present

## 2022-10-06 DIAGNOSIS — Z21 Asymptomatic human immunodeficiency virus [HIV] infection status: Secondary | ICD-10-CM | POA: Insufficient documentation

## 2022-10-06 LAB — URINALYSIS, ROUTINE W REFLEX MICROSCOPIC
Bilirubin Urine: NEGATIVE
Glucose, UA: NEGATIVE mg/dL
Hgb urine dipstick: NEGATIVE
Ketones, ur: NEGATIVE mg/dL
Nitrite: NEGATIVE
Protein, ur: NEGATIVE mg/dL
Specific Gravity, Urine: 1.025 (ref 1.005–1.030)
pH: 6.5 (ref 5.0–8.0)

## 2022-10-06 LAB — URINALYSIS, MICROSCOPIC (REFLEX): RBC / HPF: NONE SEEN RBC/hpf (ref 0–5)

## 2022-10-06 LAB — PREGNANCY, URINE: Preg Test, Ur: NEGATIVE
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
## 2022-10-09 ENCOUNTER — Ambulatory Visit (HOSPITAL_COMMUNITY)
Admission: RE | Admit: 2022-10-09 | Discharge: 2022-10-09 | Disposition: A | Discharge status: Home / Self Care | Payer: Medicaid Other | Source: Ambulatory Visit | Attending: Emergency Medicine | Admitting: Emergency Medicine

## 2022-10-09 DIAGNOSIS — M48061 Spinal stenosis, lumbar region without neurogenic claudication: Secondary | ICD-10-CM | POA: Diagnosis not present

## 2022-10-09 DIAGNOSIS — M545 Low back pain, unspecified: Secondary | ICD-10-CM | POA: Insufficient documentation

## 2022-10-09 DIAGNOSIS — M5136 Other intervertebral disc degeneration, lumbar region: Secondary | ICD-10-CM | POA: Insufficient documentation

## 2023-08-22 ENCOUNTER — Encounter (HOSPITAL_BASED_OUTPATIENT_CLINIC_OR_DEPARTMENT_OTHER): Payer: Self-pay

## 2023-08-22 ENCOUNTER — Other Ambulatory Visit: Payer: Self-pay

## 2023-08-22 ENCOUNTER — Emergency Department (HOSPITAL_BASED_OUTPATIENT_CLINIC_OR_DEPARTMENT_OTHER)
Admission: EM | Admit: 2023-08-22 | Discharge: 2023-08-22 | Disposition: A | Discharge status: Home / Self Care | Payer: Medicaid Other | Attending: Emergency Medicine | Admitting: Emergency Medicine

## 2023-08-22 ENCOUNTER — Emergency Department (HOSPITAL_BASED_OUTPATIENT_CLINIC_OR_DEPARTMENT_OTHER): Payer: Medicaid Other

## 2023-08-22 DIAGNOSIS — M25561 Pain in right knee: Secondary | ICD-10-CM | POA: Diagnosis present

## 2023-08-22 DIAGNOSIS — Z21 Asymptomatic human immunodeficiency virus [HIV] infection status: Secondary | ICD-10-CM | POA: Insufficient documentation

## 2023-08-22 MED ORDER — ACETAMINOPHEN 325 MG PO TABS
650.0000 mg | ORAL_TABLET | Freq: Once | ORAL | Status: AC
  Administered 2023-08-22: 650 mg via ORAL
  Filled 2023-08-22: qty 2
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
## 2023-08-22 NOTE — Discharge Instructions (Signed)
Please follow-up with your orthopedist in regards to recent ER visit.  Today your exam does show a mildly swollen knee however the rest of your exam is reassuring as there is also a Baker's cyst highly indicative of arthritis.  You may use anti-inflammatories every 6 hours as needed for pain, use the knee sleeve I provided, ice and elevate your leg.  Your x-ray was negative for any new pathology.  Please monitor symptoms if symptoms change or worsen please return to ER.

## 2023-08-22 NOTE — ED Triage Notes (Signed)
The patient having right knee pain for two days. No injury
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
## 2024-10-26 ENCOUNTER — Emergency Department (HOSPITAL_BASED_OUTPATIENT_CLINIC_OR_DEPARTMENT_OTHER)
Admission: EM | Admit: 2024-10-26 | Discharge: 2024-10-26 | Disposition: A | Discharge status: Home / Self Care | Attending: Emergency Medicine | Admitting: Emergency Medicine

## 2024-10-26 ENCOUNTER — Encounter (HOSPITAL_BASED_OUTPATIENT_CLINIC_OR_DEPARTMENT_OTHER): Payer: Self-pay | Admitting: Emergency Medicine

## 2024-10-26 ENCOUNTER — Other Ambulatory Visit: Payer: Self-pay

## 2024-10-26 DIAGNOSIS — S0501XA Injury of conjunctiva and corneal abrasion without foreign body, right eye, initial encounter: Secondary | ICD-10-CM | POA: Insufficient documentation

## 2024-10-26 DIAGNOSIS — X58XXXA Exposure to other specified factors, initial encounter: Secondary | ICD-10-CM | POA: Insufficient documentation

## 2024-10-26 DIAGNOSIS — H5711 Ocular pain, right eye: Secondary | ICD-10-CM | POA: Diagnosis present

## 2024-10-26 MED ORDER — ERYTHROMYCIN 5 MG/GM OP OINT
TOPICAL_OINTMENT | Freq: Four times a day (QID) | OPHTHALMIC | Status: DC
  Administered 2024-10-26: 1 via OPHTHALMIC
  Filled 2024-10-26: qty 3.5

## 2024-10-26 MED ORDER — FLUORESCEIN SODIUM 1 MG OP STRP
1.0000 | ORAL_STRIP | Freq: Once | OPHTHALMIC | Status: AC
  Administered 2024-10-26: 1 via OPHTHALMIC
  Filled 2024-10-26: qty 1

## 2024-10-26 MED ORDER — TETRACAINE HCL 0.5 % OP SOLN
2.0000 [drp] | Freq: Once | OPHTHALMIC | Status: AC
  Administered 2024-10-26: 2 [drp] via OPHTHALMIC
  Filled 2024-10-26: qty 4

## 2024-10-26 MED ORDER — HYDROCODONE-ACETAMINOPHEN 5-325 MG PO TABS: 1.0000 | ORAL_TABLET | Freq: Four times a day (QID) | ORAL | 0 refills | Status: DC | PRN
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
## 2024-10-26 NOTE — ED Provider Notes (Signed)
 [place] [facility] AT [facility]  Provider Note  CSN: [id number] Arrival date & time: 10/26/24 0215  History Chief Complaint  Patient presents with   Eye Problem    Robin Orozco is a 48 y.o. female glasses wearer reports she was in her usual state of health when she went to bed earlier tonight. She woke up a short time ago with pain and irritation in her R eye. She does not know of any specific injury or FB but feels like something is in there. She denies blurry vision. No recent illness. She has a remote history of trauma to that eye from a clothes hanger poking her in the medial sclera.    Home Medications Prior to Admission medications   Medication Sig Start Date End Date Taking? Authorizing Provider  HYDROcodone -acetaminophen  (NORCO/VICODIN) 5-325 MG tablet Take 1 tablet by mouth every 6 (six) hours as needed for severe pain (pain score 7-10). 10/26/24  Yes Roselyn Carlin NOVAK, MD  bictegravir-emtricitabine-tenofovir AF (BIKTARVY) 50-200-25 MG TABS tablet Take by mouth. 08/30/18   [provider]  ondansetron  (ZOFRAN  ODT) 4 MG disintegrating tablet 4mg  ODT q4 hours prn nausea/vomit 10/30/20   Floyd, Dan, DO     Allergies    Ketorolac tromethamine and Aspirin   Review of Systems   Review of Systems Please see HPI for pertinent positives and negatives  Physical Exam BP (!) 184/89 (BP Location: Right Arm)   Pulse 69   Temp 98.3 F (36.8 C) (Oral)   Resp 20   Ht 5' 6 (1.676 m)   Wt 89.8 kg   LMP 10/02/2022 (Approximate)   SpO2 96%   BMI 31.96 kg/m   Physical Exam Vitals and nursing note reviewed.  HENT:     Head: Normocephalic.     Nose: Nose normal.  Eyes:     Extraocular Movements: Extraocular movements intact.     Conjunctiva/sclera: Conjunctivae normal.     Pupils: Pupils are equal, round, and reactive to light.     Comments: Clear tearing of R eye. Anterior chamber is quiet. Pressure in R eye is , there is a large,  shallow corneal abrasion on the lower third  Pulmonary:     Effort: Pulmonary effort is normal.  Musculoskeletal:        General: Normal range of motion.     Cervical back: Neck supple.  Skin:    Findings: No rash (on exposed skin).  Neurological:     Mental Status: She is alert and oriented to person, place, and time.  Psychiatric:        Mood and Affect: Mood normal.     ED Results / Procedures / Treatments   EKG None  Procedures Procedures  Medications Ordered in the ED Medications  erythromycin  ophthalmic ointment (has no administration in time range)  fluorescein  ophthalmic strip 1 strip (1 strip Right Eye Given 10/26/24 0235)  tetracaine  (PONTOCAINE) 0.5 % ophthalmic solution 2 drop (2 drops Right Eye Given 10/26/24 0232)    Initial Impression and Plan  Patient here with R eye pain, exam reveals a cornea abrasion, likely she scratched it in her sleep. Pain improved with tetracaine , given a small amount of tetracaine  to use at home. Cautioned against over-use. Plan erythromycin  ointment and Rx for norco if needed. She is already established with Ophtho, recommend close outpatient follow up to ensure proper healing.   ED Course       MDM Rules/Calculators/A&P Medical Decision Making Problems Addressed: Abrasion  of right cornea, initial encounter: acute illness or injury  Risk Prescription drug management.     Final Clinical Impression(s) / ED Diagnoses Final diagnoses:  Abrasion of right cornea, initial encounter    Rx / DC Orders ED Discharge Orders          Ordered    HYDROcodone -acetaminophen  (NORCO/VICODIN) 5-325 MG tablet  Every 6 hours PRN        10/26/24 0250             Roselyn Carlin NOVAK, MD 10/26/24 [phone]

## 2024-10-26 NOTE — Discharge Instructions (Addendum)
 Use the eye drops sparingly for pain, not more than 2-3 times per day. Use the antibiotic ointment every 6 hours while awake. Oral pain medications if needed.

## 2024-10-28 NOTE — Patient Instructions (Signed)
 SURGICAL WAITING ROOM VISITATION  Patients having surgery or a procedure may have no more than 2 support people in the waiting area - these visitors may rotate.    Children under the age of 74 must have an adult with them who is not the patient.  Visitors with respiratory illnesses are discouraged from visiting and should remain at home.  If the patient needs to stay at the hospital during part of their recovery, the visitor guidelines for inpatient rooms apply. Pre-op nurse will coordinate an appropriate time for 1 support person to accompany patient in pre-op.  This support person may not rotate.    Please refer to the [facility] website for the visitor guidelines for Inpatients (after your surgery is over and you are in a regular room).       Your procedure is scheduled on: 11/11/24   Report to [facility] Main Entrance    Report to admitting at 9 AM   Call this number if you have problems the morning of surgery [phone]   Do not eat food :After Midnight.   After Midnight you may have the following liquids until 8:30 AM DAY OF SURGERY  Water Non-Citrus Juices (without pulp, NO RED-Apple, White grape, White cranberry) Black Coffee (NO MILK/CREAM OR CREAMERS, sugar ok)  Clear Tea (NO MILK/CREAM OR CREAMERS, sugar ok) regular and decaf                             Plain Jell-O (NO RED)                                           Fruit ices (not with fruit pulp, NO RED)                                     Popsicles (NO RED)                                                               Sports drinks like Gatorade (NO RED)                 The day of surgery:  Drink ONE (1) Pre-Surgery Clear Ensure  at 8:30 AM the morning of surgery. Drink in one sitting. Do not sip.  This drink was given to you during your hospital  pre-op appointment visit. Nothing else to drink after completing the  Pre-Surgery Clear Ensure.      Oral Hygiene is also important to reduce your risk  of infection.                                    Remember - BRUSH YOUR TEETH THE MORNING OF SURGERY WITH YOUR REGULAR TOOTHPASTE    Stop all vitamins and herbal supplements 7 days before surgery.   Take these medicines the morning of surgery with A SIP OF WATER:- Amlodipine ,Biktarvy ,Ondansetron (zofran )              You may  not have any metal on your body including hair pins, jewelry, and body piercing             Do not wear make-up, lotions, powders, perfumes/cologne, or deodorant  Do not wear nail polish including gel and S&S, artificial/acrylic nails, or any other type of covering on natural nails including finger and toenails. If you have artificial nails, gel coating, etc. that needs to be removed by a nail salon please have this removed prior to surgery or surgery may need to be canceled/ delayed if the surgeon/ anesthesia feels like they are unable to be safely monitored.   Do not shave  48 hours prior to surgery.             Do not bring valuables to the hospital. [facility] IS NOT             RESPONSIBLE   FOR VALUABLES.   Contacts, glasses, dentures or bridgework may not be worn into surgery.    DO NOT BRING YOUR HOME MEDICATIONS TO THE HOSPITAL. PHARMACY WILL DISPENSE MEDICATIONS LISTED ON YOUR MEDICATION LIST TO YOU DURING YOUR ADMISSION IN THE HOSPITAL!    Patients discharged on the day of surgery will not be allowed to drive home.  Someone NEEDS to stay with you for the first 24 hours after anesthesia.   Special Instructions: Bring a copy of your healthcare power of attorney and living will documents the day of surgery if you haven't scanned them before.              Please read over the following fact sheets you were given: IF YOU HAVE QUESTIONS ABOUT YOUR PRE-OP INSTRUCTIONS PLEASE CALL [phone] [place]   If you received a COVID test during your pre-op visit  it is requested that you wear a mask when out in public, stay away from anyone that may not be feeling  well and notify your surgeon if you develop symptoms. If you test positive for Covid or have been in contact with anyone that has tested positive in the last 10 days please notify you surgeon.      Pre-operative 4 CHG Bath Instructions  DYNA-Hex 4 Chlorhexidine Gluconate 4% Solution Antiseptic 4 fl. oz   You can play a key role in reducing the risk of infection after surgery. Your skin needs to be as free of germs as possible. You can reduce the number of germs on your skin by washing with CHG (chlorhexidine gluconate) soap before surgery. CHG is an antiseptic soap that kills germs and continues to kill germs even after washing.   DO NOT use if you have an allergy to chlorhexidine/CHG or antibacterial soaps. If your skin becomes reddened or irritated, stop using the CHG and notify one of our RNs at   Please shower with the CHG soap starting 4 days before surgery using the following schedule:     Please keep in mind the following:  DO NOT shave, including legs and underarms, starting the day of your first shower.   You may shave your face at any point before/day of surgery.  Place clean sheets on your bed the day you start using CHG soap. Use a clean washcloth (not used since being washed) for each shower. DO NOT sleep with pets once you start using the CHG.  CHG Shower Instructions:  If you choose to wash your hair and private area, wash first with your normal shampoo/soap.  After you use shampoo/soap, rinse your hair and body  thoroughly to remove shampoo/soap residue.  Turn the water OFF and apply about 3 tablespoons (45 ml) of CHG soap to a CLEAN washcloth.  Apply CHG soap ONLY FROM YOUR NECK DOWN TO YOUR TOES (washing for 3-5 minutes)  DO NOT use CHG soap on face, private areas, open wounds, or sores.  Pay special attention to the area where your surgery is being performed.  If you are having back surgery, having someone wash your back for you may be helpful. Wait 2 minutes after CHG  soap is applied, then you may rinse off the CHG soap.  Pat dry with a clean towel  Put on clean clothes/pajamas   If you choose to wear lotion, please use ONLY the CHG-compatible lotions on the back of this paper.     Additional instructions for the day of surgery: DO NOT APPLY any lotions, deodorants, cologne, or perfumes.   Put on clean/comfortable clothes.  Brush your teeth.  Ask your nurse before applying any prescription medications to the skin.   CHG Compatible Lotions   Aveeno Moisturizing lotion  Cetaphil Moisturizing Cream  Cetaphil Moisturizing Lotion  Clairol Herbal Essence Moisturizing Lotion, Dry Skin  Clairol Herbal Essence Moisturizing Lotion, Extra Dry Skin  Clairol Herbal Essence Moisturizing Lotion, Normal Skin  Curel Age Defying Therapeutic Moisturizing Lotion with Alpha Hydroxy  Curel Extreme Care Body Lotion  Curel Soothing Hands Moisturizing Hand Lotion  Curel Therapeutic Moisturizing Cream, Fragrance-Free  Curel Therapeutic Moisturizing Lotion, Fragrance-Free  Curel Therapeutic Moisturizing Lotion, Original Formula  Eucerin Daily Replenishing Lotion  Eucerin Dry Skin Therapy Plus Alpha Hydroxy Crme  Eucerin Dry Skin Therapy Plus Alpha Hydroxy Lotion  Eucerin Original Crme  Eucerin Original Lotion  Eucerin Plus Crme Eucerin Plus Lotion  Eucerin TriLipid Replenishing Lotion  Keri Anti-Bacterial Hand Lotion  Keri Deep Conditioning Original Lotion Dry Skin Formula Softly Scented  Keri Deep Conditioning Original Lotion, Fragrance Free Sensitive Skin Formula  Keri Lotion Fast Absorbing Fragrance Free Sensitive Skin Formula  Keri Lotion Fast Absorbing Softly Scented Dry Skin Formula  Keri Original Lotion  Keri Skin Renewal Lotion Keri Silky Smooth Lotion  Keri Silky Smooth Sensitive Skin Lotion  Nivea Body Creamy Conditioning Oil  Nivea Body Extra Enriched Lotion  Nivea Body Original Lotion  Nivea Body Sheer Moisturizing Lotion Nivea Crme  Nivea Skin  Firming Lotion  NutraDerm 30 Skin Lotion  NutraDerm Skin Lotion  NutraDerm Therapeutic Skin Cream  NutraDerm Therapeutic Skin Lotion  ProShield Protective Hand Cream  Incentive Spirometer An incentive spirometer is a tool that can help keep your lungs clear and active. This tool measures how well you are filling your lungs with each breath. Taking long deep breaths may help reverse or decrease the chance of developing breathing (pulmonary) problems (especially infection) following: A long period of time when you are unable to move or be active. BEFORE THE PROCEDURE  If the spirometer includes an indicator to show your best effort, your nurse or respiratory therapist will set it to a desired goal. If possible, sit up straight or lean slightly forward. Try not to slouch. Hold the incentive spirometer in an upright position. INSTRUCTIONS FOR USE  Sit on the edge of your bed if possible, or sit up as far as you can in bed or on a chair. Hold the incentive spirometer in an upright position. Breathe out normally. Place the mouthpiece in your mouth and seal your lips tightly around it. Breathe in slowly and as deeply as possible, raising the piston or  the ball toward the top of the column. Hold your breath for 3-5 seconds or for as long as possible. Allow the piston or ball to fall to the bottom of the column. Remove the mouthpiece from your mouth and breathe out normally. Rest for a few seconds and repeat Steps 1 through 7 at least 10 times every 1-2 hours when you are awake. Take your time and take a few normal breaths between deep breaths. The spirometer may include an indicator to show your best effort. Use the indicator as a goal to work toward during each repetition. After each set of 10 deep breaths, practice coughing to be sure your lungs are clear. If you have an incision (the cut made at the time of surgery), support your incision when coughing by placing a pillow or rolled up towels  firmly against it. Once you are able to get out of bed, walk around indoors and cough well. You may stop using the incentive spirometer when instructed by your caregiver.  RISKS AND COMPLICATIONS Take your time so you do not get dizzy or light-headed. If you are in pain, you may need to take or ask for pain medication before doing incentive spirometry. It is harder to take a deep breath if you are having pain. AFTER USE Rest and breathe slowly and easily. It can be helpful to keep track of a log of your progress. Your caregiver can provide you with a simple table to help with this. If you are using the spirometer at home, follow these instructions: SEEK MEDICAL CARE IF:  You are having difficultly using the spirometer. You have trouble using the spirometer as often as instructed. Your pain medication is not giving enough relief while using the spirometer. You develop fever of 100.5 F (38.1 C) or higher. SEEK IMMEDIATE MEDICAL CARE IF:  You cough up bloody sputum that had not been present before. You develop fever of 102 F (38.9 C) or greater. You develop worsening pain at or near the incision site. MAKE SURE YOU:  Understand these instructions. Will watch your condition. Will get help right away if you are not doing well or get worse. Document Released: 03/23/2007 Document Revised: 02/02/2012 Document Reviewed: 05/24/2007 [facility] Patient Information 2014 [place], [place].

## 2024-10-28 NOTE — Progress Notes (Addendum)
 COVID Vaccine received:  []  No [x]  Yes Date of any COVID positive Test in last 90 days: no PCP - [facility] Cardiologist - n/a  Chest x-ray -  EKG -   Stress Test -  ECHO -  Cardiac Cath -   Bowel Prep - [x]  No  []   Yes ______  Pacemaker / ICD device [x]  No []  Yes   Spinal Cord Stimulator:[x]  No []  Yes       History of Sleep Apnea? [x]  No []  Yes   CPAP used?- [x]  No []  Yes    Does the patient monitor blood sugar?          [x]  No []  Yes  []  N/A  Patient has: [x]  NO Hx DM   []  Pre-DM                 []  DM1  []   DM2 Does patient have a [employer] or Dexacom? []  No []  Yes   Fasting Blood Sugar Ranges-  Checks Blood Sugar _____ times a day  GLP1 agonist / usual dose - no GLP1 instructions:  SGLT-2 inhibitors / usual dose - no SGLT-2 instructions:   Blood Thinner / Instructions:no Aspirin Instructions:no  Comments:   Activity level: Patient is able to climb a flight of stairs without difficulty; [x]  No CP  [x]  No SOB,   Patient can perform ADLs without assistance.   Anesthesia review:   Patient denies shortness of breath, fever, cough and chest pain at PAT appointment.  Patient verbalized understanding and agreement to the Pre-Surgical Instructions that were given to them at this PAT appointment. Patient was also educated of the need to review these PAT instructions again prior to his/her surgery.I reviewed the appropriate phone numbers to call if they have any and questions or concerns.

## 2024-10-31 ENCOUNTER — Encounter (HOSPITAL_COMMUNITY)
Admission: RE | Admit: 2024-10-31 | Discharge: 2024-10-31 | Disposition: A | Source: Ambulatory Visit | Attending: Orthopedic Surgery

## 2024-10-31 ENCOUNTER — Other Ambulatory Visit: Payer: Self-pay

## 2024-10-31 ENCOUNTER — Encounter (HOSPITAL_COMMUNITY): Payer: Self-pay

## 2024-10-31 VITALS — BP 162/86 | HR 63 | Temp 98.3°F | Resp 16 | Ht 66.0 in | Wt 206.0 lb

## 2024-10-31 DIAGNOSIS — Z01818 Encounter for other preprocedural examination: Secondary | ICD-10-CM

## 2024-10-31 DIAGNOSIS — I10 Essential (primary) hypertension: Secondary | ICD-10-CM

## 2024-10-31 HISTORY — DX: Unspecified osteoarthritis, unspecified site: M19.90

## 2024-10-31 HISTORY — DX: Personal history of urinary calculi: Z87.442

## 2024-10-31 HISTORY — DX: Essential (primary) hypertension: I10

## 2024-10-31 HISTORY — DX: Disease of blood and blood-forming organs, unspecified: D75.9

## 2024-10-31 LAB — CBC
HCT: 40.2 % (ref 36.0–46.0)
Hemoglobin: 13.3 g/dL (ref 12.0–15.0)
MCH: 30.7 pg (ref 26.0–34.0)
MCHC: 33.1 g/dL (ref 30.0–36.0)
MCV: 92.8 fL (ref 80.0–100.0)
Platelets: 286 K/uL (ref 150–400)
RBC: 4.33 MIL/uL (ref 3.87–5.11)
RDW: 13.1 % (ref 11.5–15.5)
WBC: 4.9 K/uL (ref 4.0–10.5)
nRBC: 0 % (ref 0.0–0.2)

## 2024-10-31 LAB — BASIC METABOLIC PANEL WITH GFR
Anion gap: 9 (ref 5–15)
BUN: 12 mg/dL (ref 6–20)
CO2: 26 mmol/L (ref 22–32)
Calcium: 9.9 mg/dL (ref 8.9–10.3)
Chloride: 104 mmol/L (ref 98–111)
Creatinine, Ser: 0.83 mg/dL (ref 0.44–1.00)
GFR, Estimated: >60 mL/min (ref >60)
Glucose, Bld: 98 mg/dL (ref 70–99)
Potassium: 4.1 mmol/L (ref 3.5–5.1)
Sodium: 140 mmol/L (ref 135–145)

## 2024-10-31 LAB — SURGICAL PCR SCREEN
MRSA, PCR: NEGATIVE
Staphylococcus aureus: POSITIVE — AB

## 2024-10-31 NOTE — Progress Notes (Signed)
 Request sent to Dr. CANDIE Her to review pt's pre op PCR.

## 2024-11-10 NOTE — Anesthesia Preprocedure Evaluation (Signed)
 Anesthesia Evaluation    Airway        Dental   Pulmonary           Cardiovascular hypertension (amlodipine ), Pt. on medications      Neuro/Psych    GI/Hepatic   Endo/Other    Renal/GU      Musculoskeletal  (+) Arthritis ,    Abdominal   Peds  Hematology  (+) HIV (on Biktarvy )Lab Results      Component                Value               Date                      WBC                      4.9                 10/31/2024                HGB                      13.3                10/31/2024                HCT                      40.2                10/31/2024                MCV                      92.8                10/31/2024                PLT                      286                 10/31/2024              Anesthesia Other Findings   Reproductive/Obstetrics                              Anesthesia Physical Anesthesia Plan  ASA: 2  Anesthesia Plan: MAC and Spinal   Post-op Pain Management: Regional block* and Tylenol  PO (pre-op)*   Induction: Intravenous  PONV Risk Score and Plan: 2 and Ondansetron , Dexamethasone , Propofol  infusion, TIVA, Midazolam  and Treatment may vary due to age or medical condition  Airway Management Planned: Natural Airway and Simple Face Mask  Additional Equipment:   Intra-op Plan:   Post-operative Plan:   Informed Consent:   Plan Discussed with:   Anesthesia Plan Comments:         Anesthesia Quick Evaluation

## 2024-11-11 ENCOUNTER — Observation Stay (HOSPITAL_COMMUNITY)
Admission: RE | Admit: 2024-11-11 | Discharge: 2024-11-13 | Disposition: A | Attending: Orthopedic Surgery | Admitting: Orthopedic Surgery

## 2024-11-11 ENCOUNTER — Ambulatory Visit (HOSPITAL_BASED_OUTPATIENT_CLINIC_OR_DEPARTMENT_OTHER): Payer: Self-pay | Admitting: Anesthesiology

## 2024-11-11 ENCOUNTER — Encounter (HOSPITAL_COMMUNITY): Admission: RE | Disposition: A | Payer: Self-pay | Source: Ambulatory Visit | Attending: Orthopedic Surgery

## 2024-11-11 ENCOUNTER — Encounter (HOSPITAL_COMMUNITY): Payer: Self-pay | Admitting: Orthopedic Surgery

## 2024-11-11 ENCOUNTER — Encounter (HOSPITAL_COMMUNITY): Payer: Self-pay | Admitting: Medical

## 2024-11-11 ENCOUNTER — Other Ambulatory Visit: Payer: Self-pay

## 2024-11-11 DIAGNOSIS — I10 Essential (primary) hypertension: Secondary | ICD-10-CM | POA: Insufficient documentation

## 2024-11-11 DIAGNOSIS — M1711 Unilateral primary osteoarthritis, right knee: Secondary | ICD-10-CM | POA: Diagnosis present

## 2024-11-11 DIAGNOSIS — Z96651 Presence of right artificial knee joint: Principal | ICD-10-CM

## 2024-11-11 DIAGNOSIS — B20 Human immunodeficiency virus [HIV] disease: Secondary | ICD-10-CM | POA: Diagnosis not present

## 2024-11-11 DIAGNOSIS — Z79899 Other long term (current) drug therapy: Secondary | ICD-10-CM | POA: Diagnosis not present

## 2024-11-11 HISTORY — PX: TOTAL KNEE ARTHROPLASTY: SHX125

## 2024-11-11 SURGERY — ARTHROPLASTY, KNEE, TOTAL
Anesthesia: Monitor Anesthesia Care | Site: Knee | Laterality: Right

## 2024-11-11 MED ORDER — PROPOFOL 500 MG/50ML IV EMUL
INTRAVENOUS | Status: AC
  Filled 2024-11-11: qty 50

## 2024-11-11 MED ORDER — PROPOFOL 1000 MG/100ML IV EMUL
INTRAVENOUS | Status: AC
  Filled 2024-11-11: qty 100

## 2024-11-11 MED ORDER — BUPIVACAINE IN DEXTROSE 0.75-8.25 % IT SOLN
INTRATHECAL | Status: DC | PRN
  Administered 2024-11-11: 1.8 mL via INTRATHECAL

## 2024-11-11 MED ORDER — 0.9 % SODIUM CHLORIDE (POUR BTL) OPTIME
TOPICAL | Status: DC | PRN
  Administered 2024-11-11: 1000 mL

## 2024-11-11 MED ORDER — ORAL CARE MOUTH RINSE: 15.0000 mL | Freq: Once | OROMUCOSAL | Status: AC

## 2024-11-11 MED ORDER — TETRACAINE HCL 0.5 % OP SOLN
1.0000 [drp] | Freq: Once | OPHTHALMIC | Status: DC
  Filled 2024-11-11: qty 2

## 2024-11-11 MED ORDER — METHOCARBAMOL 500 MG PO TABS: 500.0000 mg | ORAL_TABLET | Freq: Three times a day (TID) | ORAL | 1 refills | Status: AC | PRN

## 2024-11-11 MED ORDER — OXYCODONE HCL 5 MG/5ML PO SOLN: 5.0000 mg | Freq: Once | ORAL | Status: AC | PRN

## 2024-11-11 MED ORDER — PROPOFOL 10 MG/ML IV BOLUS
INTRAVENOUS | Status: DC | PRN
  Administered 2024-11-11: 125 ug/kg/min via INTRAVENOUS
  Administered 2024-11-11: 20 mg via INTRAVENOUS

## 2024-11-11 MED ORDER — BUPIVACAINE LIPOSOME 1.3 % IJ SUSP
INTRAMUSCULAR | Status: AC
  Filled 2024-11-11: qty 20

## 2024-11-11 MED ORDER — FENTANYL CITRATE (PF) 50 MCG/ML IJ SOSY: 50.0000 ug | PREFILLED_SYRINGE | Freq: Once | INTRAMUSCULAR | Status: AC

## 2024-11-11 MED ORDER — ROPIVACAINE HCL 5 MG/ML IJ SOLN
INTRAMUSCULAR | Status: DC | PRN
  Administered 2024-11-11: 20 mL via PERINEURAL

## 2024-11-11 MED ORDER — DEXAMETHASONE SOD PHOSPHATE PF 10 MG/ML IJ SOLN
INTRAMUSCULAR | Status: DC | PRN
  Administered 2024-11-11: 10 mg via INTRAVENOUS

## 2024-11-11 MED ORDER — OXYCODONE HCL 5 MG PO TABS
ORAL_TABLET | ORAL | Status: AC
  Filled 2024-11-11: qty 1

## 2024-11-11 MED ORDER — CEFAZOLIN SODIUM-DEXTROSE 2-4 GM/100ML-% IV SOLN
2.0000 g | INTRAVENOUS | Status: AC
  Administered 2024-11-11: 2 g via INTRAVENOUS
  Filled 2024-11-11: qty 100

## 2024-11-11 MED ORDER — AMISULPRIDE (ANTIEMETIC) 5 MG/2ML IV SOLN
10.0000 mg | Freq: Once | INTRAVENOUS | Status: AC | PRN
  Administered 2024-11-11: 10 mg via INTRAVENOUS

## 2024-11-11 MED ORDER — EPHEDRINE SULFATE-NACL 50-0.9 MG/10ML-% IV SOSY
PREFILLED_SYRINGE | INTRAVENOUS | Status: DC | PRN
  Administered 2024-11-11: 5 mg via INTRAVENOUS

## 2024-11-11 MED ORDER — FENTANYL CITRATE (PF) 50 MCG/ML IJ SOSY
PREFILLED_SYRINGE | INTRAMUSCULAR | Status: AC
  Administered 2024-11-11: 50 ug via INTRAVENOUS
  Filled 2024-11-11: qty 2

## 2024-11-11 MED ORDER — OXYCODONE HCL 5 MG PO TABS
5.0000 mg | ORAL_TABLET | ORAL | Status: DC | PRN
  Administered 2024-11-11: 5 mg via ORAL
  Administered 2024-11-12 – 2024-11-13 (×6): 10 mg via ORAL
  Filled 2024-11-11 (×7): qty 2

## 2024-11-11 MED ORDER — HYDROMORPHONE HCL 1 MG/ML IJ SOLN
0.5000 mg | INTRAMUSCULAR | Status: DC | PRN
  Administered 2024-11-12: 0.5 mg via INTRAVENOUS
  Administered 2024-11-12 – 2024-11-13 (×2): 1 mg via INTRAVENOUS
  Filled 2024-11-11 (×3): qty 1

## 2024-11-11 MED ORDER — MUPIROCIN 2 % EX OINT: 1.0000 | TOPICAL_OINTMENT | Freq: Two times a day (BID) | CUTANEOUS | 0 refills | Status: AC

## 2024-11-11 MED ORDER — TETRACAINE HCL 0.5 % OP SOLN
1.0000 [drp] | Freq: Once | OPHTHALMIC | Status: AC
  Administered 2024-11-11: 1 [drp] via OPHTHALMIC

## 2024-11-11 MED ORDER — FENTANYL CITRATE (PF) 100 MCG/2ML IJ SOLN
INTRAMUSCULAR | Status: DC | PRN
  Administered 2024-11-11 (×2): 50 ug via INTRAVENOUS

## 2024-11-11 MED ORDER — TRANEXAMIC ACID-NACL 1000-0.7 MG/100ML-% IV SOLN
INTRAVENOUS | Status: AC
  Filled 2024-11-11: qty 100

## 2024-11-11 MED ORDER — ACETAMINOPHEN 500 MG PO TABS
1000.0000 mg | ORAL_TABLET | Freq: Once | ORAL | Status: AC
  Administered 2024-11-11: 1000 mg via ORAL

## 2024-11-11 MED ORDER — ONDANSETRON HCL 4 MG/2ML IJ SOLN
4.0000 mg | Freq: Four times a day (QID) | INTRAMUSCULAR | Status: DC | PRN
  Administered 2024-11-11 – 2024-11-12 (×4): 4 mg via INTRAVENOUS
  Filled 2024-11-11 (×5): qty 2

## 2024-11-11 MED ORDER — FENTANYL CITRATE (PF) 50 MCG/ML IJ SOSY
25.0000 ug | PREFILLED_SYRINGE | INTRAMUSCULAR | Status: DC | PRN
  Administered 2024-11-11: 50 ug via INTRAVENOUS

## 2024-11-11 MED ORDER — BUPIVACAINE LIPOSOME 1.3 % IJ SUSP: 20.0000 mL | Freq: Once | INTRAMUSCULAR | Status: DC

## 2024-11-11 MED ORDER — ACETAMINOPHEN 325 MG PO TABS
325.0000 mg | ORAL_TABLET | Freq: Four times a day (QID) | ORAL | Status: DC | PRN
  Administered 2024-11-12: 650 mg via ORAL
  Filled 2024-11-11: qty 2

## 2024-11-11 MED ORDER — SODIUM CHLORIDE 0.9 % IR SOLN
Status: DC | PRN
  Administered 2024-11-11: 1000 mL

## 2024-11-11 MED ORDER — ACETAMINOPHEN 500 MG PO TABS
1000.0000 mg | ORAL_TABLET | Freq: Once | ORAL | Status: AC
  Administered 2024-11-11: 1000 mg via ORAL
  Filled 2024-11-11: qty 2

## 2024-11-11 MED ORDER — SODIUM CHLORIDE 0.9 % IV SOLN: INTRAVENOUS | Status: AC

## 2024-11-11 MED ORDER — POLYMYXIN B-TRIMETHOPRIM 10000-0.1 UNIT/ML-% OP SOLN
1.0000 [drp] | Freq: Four times a day (QID) | OPHTHALMIC | Status: AC
  Administered 2024-11-11 – 2024-11-12 (×4): 1 [drp] via OPHTHALMIC
  Filled 2024-11-11: qty 10

## 2024-11-11 MED ORDER — PHENOL 1.4 % MT LIQD: 1.0000 | OROMUCOSAL | Status: DC | PRN

## 2024-11-11 MED ORDER — METOCLOPRAMIDE HCL 5 MG PO TABS: 5.0000 mg | ORAL_TABLET | Freq: Three times a day (TID) | ORAL | Status: DC | PRN

## 2024-11-11 MED ORDER — MIDAZOLAM HCL (PF) 2 MG/2ML IJ SOLN: 1.0000 mg | Freq: Once | INTRAMUSCULAR | Status: AC

## 2024-11-11 MED ORDER — CEFAZOLIN SODIUM-DEXTROSE 2-4 GM/100ML-% IV SOLN
2.0000 g | Freq: Three times a day (TID) | INTRAVENOUS | Status: DC
  Administered 2024-11-11 – 2024-11-13 (×5): 2 g via INTRAVENOUS
  Filled 2024-11-11 (×5): qty 100

## 2024-11-11 MED ORDER — FENTANYL CITRATE (PF) 50 MCG/ML IJ SOSY
PREFILLED_SYRINGE | INTRAMUSCULAR | Status: AC
  Filled 2024-11-11: qty 2

## 2024-11-11 MED ORDER — TRANEXAMIC ACID-NACL 1000-0.7 MG/100ML-% IV SOLN
1000.0000 mg | INTRAVENOUS | Status: AC
  Administered 2024-11-11: 1000 mg via INTRAVENOUS
  Filled 2024-11-11: qty 100

## 2024-11-11 MED ORDER — ONDANSETRON HCL 4 MG/2ML IJ SOLN
INTRAMUSCULAR | Status: AC
  Filled 2024-11-11: qty 2

## 2024-11-11 MED ORDER — DOCUSATE SODIUM 100 MG PO CAPS
100.0000 mg | ORAL_CAPSULE | Freq: Two times a day (BID) | ORAL | Status: DC
  Administered 2024-11-11 – 2024-11-13 (×4): 100 mg via ORAL
  Filled 2024-11-11 (×4): qty 1

## 2024-11-11 MED ORDER — VALACYCLOVIR HCL 500 MG PO TABS
1000.0000 mg | ORAL_TABLET | Freq: Every day | ORAL | Status: DC
  Administered 2024-11-12 – 2024-11-13 (×2): 1000 mg via ORAL
  Filled 2024-11-11 (×2): qty 2

## 2024-11-11 MED ORDER — METHOCARBAMOL 1000 MG/10ML IJ SOLN: 500.0000 mg | Freq: Four times a day (QID) | INTRAMUSCULAR | Status: DC | PRN

## 2024-11-11 MED ORDER — METOCLOPRAMIDE HCL 5 MG/ML IJ SOLN
5.0000 mg | Freq: Three times a day (TID) | INTRAMUSCULAR | Status: DC | PRN
  Administered 2024-11-12: 10 mg via INTRAVENOUS
  Filled 2024-11-11: qty 2

## 2024-11-11 MED ORDER — PHENYLEPHRINE HCL-NACL 20-0.9 MG/250ML-% IV SOLN
INTRAVENOUS | Status: DC | PRN
  Administered 2024-11-11: 25 ug/min via INTRAVENOUS

## 2024-11-11 MED ORDER — ONDANSETRON HCL 4 MG/2ML IJ SOLN
INTRAMUSCULAR | Status: DC | PRN
  Administered 2024-11-11: 4 mg via INTRAVENOUS

## 2024-11-11 MED ORDER — SODIUM CHLORIDE 0.9 % IV SOLN
INTRAVENOUS | Status: DC | PRN
  Administered 2024-11-11: 80 mL

## 2024-11-11 MED ORDER — OXYCODONE HCL 5 MG PO TABS
5.0000 mg | ORAL_TABLET | Freq: Once | ORAL | Status: AC | PRN
  Administered 2024-11-11: 5 mg via ORAL

## 2024-11-11 MED ORDER — APIXABAN 5 MG PO TABS: 5.0000 mg | ORAL_TABLET | Freq: Two times a day (BID) | ORAL | 0 refills | Status: DC

## 2024-11-11 MED ORDER — MENTHOL 3 MG MT LOZG: 1.0000 | LOZENGE | OROMUCOSAL | Status: DC | PRN

## 2024-11-11 MED ORDER — AMISULPRIDE (ANTIEMETIC) 5 MG/2ML IV SOLN
INTRAVENOUS | Status: AC
  Filled 2024-11-11: qty 4

## 2024-11-11 MED ORDER — ONDANSETRON HCL 4 MG PO TABS
4.0000 mg | ORAL_TABLET | Freq: Four times a day (QID) | ORAL | Status: DC | PRN
  Administered 2024-11-13: 4 mg via ORAL
  Filled 2024-11-11: qty 1

## 2024-11-11 MED ORDER — METHOCARBAMOL 500 MG PO TABS
500.0000 mg | ORAL_TABLET | Freq: Four times a day (QID) | ORAL | Status: DC | PRN
  Administered 2024-11-11 – 2024-11-13 (×5): 500 mg via ORAL
  Filled 2024-11-11 (×4): qty 1

## 2024-11-11 MED ORDER — CHLORHEXIDINE GLUCONATE 0.12 % MT SOLN
15.0000 mL | Freq: Once | OROMUCOSAL | Status: AC
  Administered 2024-11-11: 15 mL via OROMUCOSAL

## 2024-11-11 MED ORDER — ACETAMINOPHEN 500 MG PO TABS
ORAL_TABLET | ORAL | Status: AC
  Filled 2024-11-11: qty 2

## 2024-11-11 MED ORDER — BICTEGRAVIR-EMTRICITAB-TENOFOV 50-200-25 MG PO TABS
1.0000 | ORAL_TABLET | Freq: Every day | ORAL | Status: DC
  Administered 2024-11-11 – 2024-11-13 (×3): 1 via ORAL
  Filled 2024-11-11 (×3): qty 1

## 2024-11-11 MED ORDER — POVIDONE-IODINE 10 % EX SWAB: 2.0000 | Freq: Once | CUTANEOUS | Status: DC

## 2024-11-11 MED ORDER — OXYCODONE HCL 5 MG PO TABS: 5.0000 mg | ORAL_TABLET | Freq: Three times a day (TID) | ORAL | 0 refills | Status: AC | PRN

## 2024-11-11 MED ORDER — FENTANYL CITRATE (PF) 100 MCG/2ML IJ SOLN
INTRAMUSCULAR | Status: AC
  Filled 2024-11-11: qty 2

## 2024-11-11 MED ORDER — SODIUM CHLORIDE (PF) 0.9 % IJ SOLN
INTRAMUSCULAR | Status: AC
  Filled 2024-11-11: qty 50

## 2024-11-11 MED ORDER — BSS IO SOLN
15.0000 mL | Freq: Once | INTRAOCULAR | Status: AC
  Administered 2024-11-11: 15 mL
  Filled 2024-11-11: qty 15

## 2024-11-11 MED ORDER — METHOCARBAMOL 500 MG PO TABS
ORAL_TABLET | ORAL | Status: AC
  Filled 2024-11-11: qty 1

## 2024-11-11 MED ORDER — LACTATED RINGERS IV SOLN: INTRAVENOUS | Status: DC

## 2024-11-11 MED ORDER — CHLORHEXIDINE GLUCONATE 4 % EX SOLN: 1.0000 | CUTANEOUS | 1 refills | Status: AC

## 2024-11-11 MED ORDER — AMLODIPINE BESYLATE 10 MG PO TABS
10.0000 mg | ORAL_TABLET | Freq: Every day | ORAL | Status: DC
  Administered 2024-11-13: 10 mg via ORAL
  Filled 2024-11-11 (×2): qty 1

## 2024-11-11 MED ORDER — TRANEXAMIC ACID-NACL 1000-0.7 MG/100ML-% IV SOLN
1000.0000 mg | Freq: Once | INTRAVENOUS | Status: AC
  Administered 2024-11-11: 1000 mg via INTRAVENOUS

## 2024-11-11 MED ORDER — ACETAMINOPHEN 10 MG/ML IV SOLN
INTRAVENOUS | Status: AC
  Filled 2024-11-11: qty 100

## 2024-11-11 MED ORDER — BUPIVACAINE-EPINEPHRINE (PF) 0.25% -1:200000 IJ SOLN
INTRAMUSCULAR | Status: AC
  Filled 2024-11-11: qty 30

## 2024-11-11 MED ORDER — MIDAZOLAM HCL 2 MG/2ML IJ SOLN
INTRAMUSCULAR | Status: AC
  Administered 2024-11-11: 2 mg via INTRAVENOUS
  Filled 2024-11-11: qty 2

## 2024-11-11 SURGICAL SUPPLY — 45 items
ATTUNE MED DOME PAT 32 KNEE (Knees) IMPLANT
ATTUNE PS FEM RT SZ 3 CEM KNEE (Femur) IMPLANT
ATTUNE PSRP INSR SZ3 6 KNEE (Insert) IMPLANT
BAG COUNTER SPONGE SURGICOUNT (BAG) IMPLANT
BAG ZIPLOCK 12X15 (MISCELLANEOUS) ×1 IMPLANT
BASEPLATE TIBIAL ROTATING SZ 4 (Knees) IMPLANT
BLADE SAG 18X100X1.27 (BLADE) ×1 IMPLANT
BLADE SAW SGTL 11.0X1.19X90.0M (BLADE) IMPLANT
BLADE SAW SGTL 13X75X1.27 (BLADE) ×1 IMPLANT
BNDG ELASTIC 6X10 VLCR STRL LF (GAUZE/BANDAGES/DRESSINGS) ×1 IMPLANT
BNDG GAUZE DERMACEA FLUFF 4 (GAUZE/BANDAGES/DRESSINGS) ×1 IMPLANT
BOWL SMART MIX CTS (DISPOSABLE) ×1 IMPLANT
CEMENT HV SMART SET (Cement) ×2 IMPLANT
COVER SURGICAL LIGHT HANDLE (MISCELLANEOUS) ×1 IMPLANT
CUFF TRNQT CYL 34X4.125X (TOURNIQUET CUFF) ×1 IMPLANT
DRAPE SHEET LG 3/4 BI-LAMINATE (DRAPES) ×1 IMPLANT
DRAPE U-SHAPE 47X51 STRL (DRAPES) ×1 IMPLANT
DRSG ADAPTIC 3X8 NADH LF (GAUZE/BANDAGES/DRESSINGS) ×1 IMPLANT
DURAPREP 26ML APPLICATOR (WOUND CARE) ×1 IMPLANT
ELECT PENCIL ROCKER SW 15FT (MISCELLANEOUS) ×1 IMPLANT
ELECT REM PT RETURN 15FT ADLT (MISCELLANEOUS) ×1 IMPLANT
GAUZE PAD ABD 8X10 STRL (GAUZE/BANDAGES/DRESSINGS) ×1 IMPLANT
GAUZE SPONGE 4X4 12PLY STRL (GAUZE/BANDAGES/DRESSINGS) ×1 IMPLANT
GLOVE BIOGEL PI IND STRL 7.5 (GLOVE) ×1 IMPLANT
GLOVE BIOGEL PI IND STRL 8.5 (GLOVE) ×1 IMPLANT
GLOVE ORTHO TXT STRL SZ7.5 (GLOVE) ×1 IMPLANT
GLOVE SURG ORTHO 8.5 STRL (GLOVE) ×1 IMPLANT
GOWN STRL REUS W/ TWL XL LVL3 (GOWN DISPOSABLE) ×2 IMPLANT
IMMOBILIZER KNEE 20 THIGH 36 (SOFTGOODS) ×1 IMPLANT
KIT TURNOVER KIT A (KITS) ×1 IMPLANT
MANIFOLD NEPTUNE II (INSTRUMENTS) ×1 IMPLANT
NS IRRIG 1000ML POUR BTL (IV SOLUTION) ×1 IMPLANT
PACK TOTAL KNEE CUSTOM (KITS) ×1 IMPLANT
PIN STEINMAN FIXATION KNEE (PIN) IMPLANT
PROTECTOR NERVE ULNAR (MISCELLANEOUS) ×1 IMPLANT
SET HNDPC FAN SPRY TIP SCT (DISPOSABLE) ×1 IMPLANT
STRIP CLOSURE SKIN 1/2X4 (GAUZE/BANDAGES/DRESSINGS) ×2 IMPLANT
SUT MNCRL AB 3-0 PS2 18 (SUTURE) ×1 IMPLANT
SUT VIC AB 0 CT1 36 (SUTURE) ×1 IMPLANT
SUT VIC AB 1 CT1 36 (SUTURE) ×2 IMPLANT
SUT VIC AB 2-0 CT1 TAPERPNT 27 (SUTURE) ×1 IMPLANT
TOWEL GREEN STERILE FF (TOWEL DISPOSABLE) ×1 IMPLANT
TRAY CATH INTERMITTENT SS 16FR (CATHETERS) ×1 IMPLANT
WATER STERILE IRR 1000ML POUR (IV SOLUTION) ×2 IMPLANT
YANKAUER SUCT BULB TIP NO VENT (SUCTIONS) ×1 IMPLANT

## 2024-11-11 NOTE — Brief Op Note (Signed)
 11/11/2024  3:21 PM  PATIENT:  Robin Orozco  48 y.o. female  PRE-OPERATIVE DIAGNOSIS:  Primary osteoarthritis of right knee  POST-OPERATIVE DIAGNOSIS:  Primary osteoarthritis of right knee  PROCEDURE:  Procedures: ARTHROPLASTY, KNEE, TOTAL (Right) DePuy Attune  SURGEON:  Surgeons and Role:    DEWAINE Kay Kemps, MD - Primary  PHYSICIAN ASSISTANT:   ASSISTANTS: Debby KATHEE Fireman, PA-C   ANESTHESIA:   regional and spinal  EBL:  25 mL   BLOOD ADMINISTERED:none  DRAINS: none   LOCAL MEDICATIONS USED:  MARCAINE      SPECIMEN:  No Specimen  DISPOSITION OF SPECIMEN:  N/A  COUNTS:  YES  TOURNIQUET:  1 hour 20 minutes at 250 mm Hg  DICTATION: .Other Dictation: Dictation Number [id number]  PLAN OF CARE: Discharge to home after PACU  PATIENT DISPOSITION:  PACU - hemodynamically stable.   Delay start of Pharmacological VTE agent (>24hrs) due to surgical blood loss or risk of bleeding: no

## 2024-11-11 NOTE — Op Note (Unsigned)
 NAMEANZAL, BARTNICK MEDICAL RECORD NO: [id number] ACCOUNT NO: [id number] DATE OF BIRTH: 01-07-1976 FACILITY: [facility] LOCATION: [facility] PHYSICIAN: Elspeth SAUNDERS. Kay, MD  Operative Report   DATE OF PROCEDURE: 11/11/2024  PREOPERATIVE DIAGNOSIS:  Right knee end-stage arthritis.  POSTOPERATIVE DIAGNOSIS:  Right knee end-stage arthritis.  PROCEDURE PERFORMED:  Right total knee arthroplasty using DePuy ATTUNE prosthesis.  ATTENDING SURGEON:  Elspeth SAUNDERS. Kay, MD  ASSISTANT:  Debby Crock [place], [place], who was scrubbed during the entire procedure, and necessary for satisfactory completion of surgery.  ANESTHESIA:  Spinal anesthesia was used plus adductor canal block.  ESTIMATED BLOOD LOSS:  Minimal.  FLUID REPLACEMENT:  1000 mL crystalloid.  COUNTS:  Instrument count was correct.  COMPLICATIONS:  There were no complications.  ANTIBIOTICS:  Perioperative antibiotics were given.  TOURNIQUET TIME:  1 hour and 20 minutes at 250 mmHg.  INDICATIONS:  The patient is a 48 year old female who presents with a history of worsening right knee pain secondary to arthritis.  The patient has had a previous arthroscopy and has failed all measures of conservative management for knee arthritis  presenting with ongoing pain limiting function and activity and quality of life desiring total knee arthroplasty in order to eliminate pain and restore function.  Informed consent obtained.  DESCRIPTION OF PROCEDURE:  After an adequate level of spinal anesthesia was achieved the patient was positioned supine on the operating room table.  A nonsterile tourniquet was placed on the right proximal thigh.  The right leg was sterilely prepped and  draped in usual manner.  Timeout called verifying correct patient and correct site.  We elevated the leg and exsanguinated with an Esmarch bandage.  We then inflated the tourniquet to 250 mmHg.  We placed the knee in flexion and performed a longitudinal  midline incision with a  10-blade scalpel.  We used a fresh 10-blade scalpel to perform a medial parapatellar arthrotomy.  We everted the patella and divided lateral patellofemoral ligaments.  This exposed full-thickness cartilage loss on the distal femur  of both medial and lateral.  We entered the distal femur with a step-cut drill.  We then placed an intramedullary guide and resected 9 millimeters off the distal femur set on 5 degrees of valgus.  We then sized the femur to a size 3 anterior down  performing anterior posterior and chamfer cuts with the 4-in-1 block.  Once we had our femoral cuts done we removed ACL and PCL meniscal tissues.  We subluxed the tibia anteriorly.  We performed a tibial cut with the external jig 90 degrees perpendicular  to the long axis of the tibia with minimal posterior slope for this posterior cruciate substituting prosthesis.  We resected 2 mm off the affected medial side.  Once we had our tibial cut done we used the laminar spreader and removed excess posterior  femoral condyle osteophytes.  We released the posterior capsule.  We then injected the posterior capsule with a combination of Marcaine , Exparel  and saline.  We then checked our gaps, which are symmetric at 5 mm.  We removed the pins from the tibia and  completed our tibial preparation with the modular jig and keel punch for the 4 tibia.  We cut the box for the 3 right femur.  We then placed a size 3 5 mm poly trial in place and reduced the knee.  We were able to get to full extension had good flexion  stability without we could probably get a 6 in for cementing.  We then  resurfaced the patella going from a 22 mm thickness down to a 14 mm thickness drilling lug holes for the 32 patellar button.  We ranged the knee with no-touch technique with excellent  patellar tracking.  We removed all trial components.  We irrigated thoroughly.  We dried the bone well and vacuum mixed high viscosity cement on the back table.  We cemented the components  into place including the 4 tibia the 3 right femur.  We placed a  size 3 6 mm poly in the knee and extended the knee compressing the cement while the cement set up and we used a patellar clamp to hold the 32 patellar button in place while the cement set up.  Once all cement was hard on the back table we removed excess  cement with a quarter-inch curved osteotome.  We injected the anterior capsule with a combination of Marcaine , Exparel  and saline.  We then selected the real size 3 6 mm poly and placed that on the tibial tray and reduced the knee.  We had a nice little  pop as it reduced and good stability and excellent patellar tracking.  We irrigated again.  We then went ahead and closed the parapatellar arthrotomy with #1 Vicryl suture followed by 2-0 Vicryl for subcutaneous closure and 4-0 Monocryl for skin.   Steri-Strips were applied followed by a sterile dressing.  The patient tolerated the surgery well.   PUS D: 11/11/2024 3:26:57 pm T: 11/11/2024 6:27:00 pm  JOB: [id number]/ [id number]

## 2024-11-11 NOTE — Addendum Note (Signed)
 Addendum  created 11/11/24 1924 by Peggye Delon Brunswick, MD   Clinical Note Signed

## 2024-11-11 NOTE — Transfer of Care (Signed)
 Immediate Anesthesia Transfer of Care Note  Patient: Robin Orozco  Procedure(s) Performed: ARTHROPLASTY, KNEE, TOTAL (Right: Knee)  Patient Location: PACU  Anesthesia Type:Spinal  Level of Consciousness: awake  Airway & Oxygen  Therapy: Patient Spontanous Breathing and Patient connected to face mask  Post-op Assessment: Report given to RN and Post -op Vital signs reviewed and stable  Post vital signs: Reviewed and stable  Last Vitals:  Vitals Value Taken Time  BP 107/61 11/11/24 15:26  Temp    Pulse 79 11/11/24 15:27  Resp 18 11/11/24 15:27  SpO2 100 % 11/11/24 15:27  Vitals shown include unfiled device data.  Last Pain:  Vitals:   11/11/24 0940  TempSrc: Oral  PainSc: 8          Complications: No notable events documented.

## 2024-11-11 NOTE — Progress Notes (Signed)
 Orthopedic Tech Progress Note Patient Details:  Robin Orozco Feb 16, 1976 [id number]  Ortho Devices Type of Ortho Device: Bone foam zero knee Ortho Device/Splint Location: RLE Ortho Device/Splint Interventions: Application   Post Interventions Patient Tolerated: Well  Massie BRAVO Robin Orozco 11/11/2024, 4:33 PM

## 2024-11-11 NOTE — Progress Notes (Signed)
 Orthopedic Tech Progress Note Patient Details:  Robin Orozco 1976-10-28 [id number] 0-30 CPM was applied in PACU. CPM will need to be removed at 2300 by nursing staff.     Post Interventions Patient Tolerated: Well  Emad Brechtel E Shamieka Gullo 11/11/2024, 7:10 PM

## 2024-11-11 NOTE — Anesthesia Postprocedure Evaluation (Signed)
"   Anesthesia Post Note  Patient: Robin Orozco  Procedure(s) Performed: ARTHROPLASTY, KNEE, TOTAL (Right: Knee)     Anesthesia Type: MAC Anesthetic complications: no Comments: Patient complained of burning pain in her eyes. She was diagnosed with a right corneal abrasion on 12/3. Antibiotic eyedrops ordered. Patient also requested the numbing medication she received on 12/3, so one dose of tetracaine  ophthalmic was ordered.   No notable events documented.  Last Vitals:  Vitals:   11/11/24 1858 11/11/24 1900  BP:  (!) 146/81  Pulse: 89 94  Resp: 17 20  Temp:    SpO2: 98% 97%    Last Pain:  Vitals:   11/11/24 1900  TempSrc:   PainSc: 3                  Robin Orozco      "

## 2024-11-11 NOTE — Discharge Instructions (Signed)
 Ice to the knee constantly.  Keep the incision covered and clean and dry for one week, then ok to get it wet in the shower. Change to the Aquacel bandage on Monday next week then ok to get it wet in the shower as the bandage is waterproof.  Do exercise as instructed every hour, please to prevent stiffness.    DO NOT prop anything under the knee, it will make your knee stiff.  Prop under the ankle to encourage your knee to go straight.   Use the walker while you are up and around for balance.  Wear your support stockings 24/7 to prevent blood clots and take Eliquis  (blood thinner) for 30 days to prevent blood clots  Follow up with Dr Kay in two weeks in the office, call [phone] for appt  Please call Dr Kay (cell) [phone] with any questions or concerns  INSTRUCTIONS AFTER JOINT REPLACEMENT   Remove items at home which could result in a fall. This includes throw rugs or furniture in walking pathways ICE to the affected joint every three hours while awake for 30 minutes at a time, for at least the first 3-5 days, and then as needed for pain and swelling.  Continue to use ice for pain and swelling. You may notice swelling that will progress down to the foot and ankle.  This is normal after surgery.  Elevate your leg when you are not up walking on it.   Continue to use the breathing machine you got in the hospital (incentive spirometer) which will help keep your temperature down.  It is common for your temperature to cycle up and down following surgery, especially at night when you are not up moving around and exerting yourself.  The breathing machine keeps your lungs expanded and your temperature down.   DIET:  As you were doing prior to hospitalization, we recommend a well-balanced diet.  DRESSING / WOUND CARE / SHOWERING  You may change your dressing 3-5 days after surgery.  Then change the dressing every day with sterile gauze.  Please use good hand washing techniques before  changing the dressing.  Do not use any lotions or creams on the incision until instructed by your surgeon.  ACTIVITY  Increase activity slowly as tolerated, but follow the weight bearing instructions below.   No driving for 6 weeks or until further direction given by your physician.  You cannot drive while taking narcotics.  No lifting or carrying greater than 10 lbs. until further directed by your surgeon. Avoid periods of inactivity such as sitting longer than an hour when not asleep. This helps prevent blood clots.  You may return to work once you are authorized by your doctor.     WEIGHT BEARING   Weight bearing as tolerated with assist device (walker, cane, etc) as directed, use it as long as suggested by your surgeon or therapist, typically at least 4-6 weeks.   EXERCISES  Results after joint replacement surgery are often greatly improved when you follow the exercise, range of motion and muscle strengthening exercises prescribed by your doctor. Safety measures are also important to protect the joint from further injury. Any time any of these exercises cause you to have increased pain or swelling, decrease what you are doing until you are comfortable again and then slowly increase them. If you have problems or questions, call your caregiver or physical therapist for advice.   Rehabilitation is important following a joint replacement. After just a few days  of immobilization, the muscles of the leg can become weakened and shrink (atrophy).  These exercises are designed to build up the tone and strength of the thigh and leg muscles and to improve motion. Often times heat used for twenty to thirty minutes before working out will loosen up your tissues and help with improving the range of motion but do not use heat for the first two weeks following surgery (sometimes heat can increase post-operative swelling).   These exercises can be done on a training (exercise) mat, on the floor, on a table  or on a bed. Use whatever works the best and is most comfortable for you.    Use music or television while you are exercising so that the exercises are a pleasant break in your day. This will make your life better with the exercises acting as a break in your routine that you can look forward to.   Perform all exercises about fifteen times, three times per day or as directed.  You should exercise both the operative leg and the other leg as well.  Exercises include:   Quad Sets - Tighten up the muscle on the front of the thigh (Quad) and hold for 5-10 seconds.   Straight Leg Raises - With your knee straight (if you were given a brace, keep it on), lift the leg to 60 degrees, hold for 3 seconds, and slowly lower the leg.  Perform this exercise against resistance later as your leg gets stronger.  Leg Slides: Lying on your back, slowly slide your foot toward your buttocks, bending your knee up off the floor (only go as far as is comfortable). Then slowly slide your foot back down until your leg is flat on the floor again.  Angel Wings: Lying on your back spread your legs to the side as far apart as you can without causing discomfort.  Hamstring Strength:  Lying on your back, push your heel against the floor with your leg straight by tightening up the muscles of your buttocks.  Repeat, but this time bend your knee to a comfortable angle, and push your heel against the floor.  You may put a pillow under the heel to make it more comfortable if necessary.   A rehabilitation program following joint replacement surgery can speed recovery and prevent re-injury in the future due to weakened muscles. Contact your doctor or a physical therapist for more information on knee rehabilitation.    CONSTIPATION  Constipation is defined medically as fewer than three stools per week and severe constipation as less than one stool per week.  Even if you have a regular bowel pattern at home, your normal regimen is likely to be  disrupted due to multiple reasons following surgery.  Combination of anesthesia, postoperative narcotics, change in appetite and fluid intake all can affect your bowels.   YOU MUST use at least one of the following options; they are listed in order of increasing strength to get the job done.  They are all available over the counter, and you may need to use some, POSSIBLY even all of these options:    Drink plenty of fluids (prune juice may be helpful) and high fiber foods Colace 100 mg by mouth twice a day  Senokot for constipation as directed and as needed Dulcolax (bisacodyl), take with full glass of water  Miralax (polyethylene glycol) once or twice a day as needed.  If you have tried all these things and are unable to have a bowel movement  in the first 3-4 days after surgery call either your surgeon or your primary doctor.    If you experience loose stools or diarrhea, hold the medications until you stool forms back up.  If your symptoms do not get better within 1 week or if they get worse, check with your doctor.  If you experience the worst abdominal pain ever or develop nausea or vomiting, please contact the office immediately for further recommendations for treatment.   ITCHING:  If you experience itching with your medications, try taking only a single pain pill, or even half a pain pill at a time.  You can also use Benadryl  over the counter for itching or also to help with sleep.   TED HOSE STOCKINGS:  Use stockings on both legs until for at least 2 weeks or as directed by physician office. They may be removed at night for sleeping.  MEDICATIONS:  See your medication summary on the After Visit Summary that nursing will review with you.  You may have some home medications which will be placed on hold until you complete the course of blood thinner medication.  It is important for you to complete the blood thinner medication as prescribed.  PRECAUTIONS:  If you experience chest pain or  shortness of breath - call 911 immediately for transfer to the hospital emergency department.   If you develop a fever greater that 101 F, purulent drainage from wound, increased redness or drainage from wound, foul odor from the wound/dressing, or calf pain - CONTACT YOUR SURGEON.                                                   FOLLOW-UP APPOINTMENTS:  If you do not already have a post-op appointment, please call the office for an appointment to be seen by your surgeon.  Guidelines for how soon to be seen are listed in your After Visit Summary, but are typically between 1-4 weeks after surgery.  OTHER INSTRUCTIONS:   Knee Replacement:  Do not place pillow under knee, focus on keeping the knee straight while resting. CPM instructions: 0-90 degrees, 2 hours in the morning, 2 hours in the afternoon, and 2 hours in the evening. Place foam block, curve side up under heel at all times except when in CPM or when walking.  DO NOT modify, tear, cut, or change the foam block in any way.  POST-OPERATIVE OPIOID TAPER INSTRUCTIONS: It is important to wean off of your opioid medication as soon as possible. If you do not need pain medication after your surgery it is ok to stop day one. Opioids include: Codeine, Hydrocodone (Norco, Vicodin), Oxycodone (Percocet, oxycontin ) and hydromorphone  amongst others.  Long term and even short term use of opiods can cause: Increased pain response Dependence Constipation Depression Respiratory depression And more.  Withdrawal symptoms can include Flu like symptoms Nausea, vomiting And more Techniques to manage these symptoms Hydrate well Eat regular healthy meals Stay active Use relaxation techniques(deep breathing, meditating, yoga) Do Not substitute Alcohol  to help with tapering If you have been on opioids for less than two weeks and do not have pain than it is ok to stop all together.  Plan to wean off of opioids This plan should start within one week post  op of your joint replacement. Maintain the same interval or time between taking each dose and  first decrease the dose.  Cut the total daily intake of opioids by one tablet each day Next start to increase the time between doses. The last dose that should be eliminated is the evening dose.   MAKE SURE YOU:  Understand these instructions.  Get help right away if you are not doing well or get worse.    Thank you for letting us  be a part of your medical care team.  It is a privilege we respect greatly.  We hope these instructions will help you stay on track for a fast and full recovery!    Information on my medicine - ELIQUIS  (apixaban ) This medication education was reviewed with me or my healthcare representative as part of my discharge preparation.  Why was Eliquis  prescribed for you? Eliquis  was prescribed for you to reduce the risk of blood clots forming after orthopedic surgery.    What do You need to know about Eliquis ? Take your Eliquis  TWICE DAILY - one tablet in the morning and one tablet in the evening with or without food.  It would be best to take the dose about the same time each day.  If you have difficulty swallowing the tablet whole please discuss with your pharmacist how to take the medication safely.  Take Eliquis  exactly as prescribed by your doctor and DO NOT stop taking Eliquis  without talking to the doctor who prescribed the medication.  Stopping without other medication to take the place of Eliquis  may increase your risk of developing a clot.  After discharge, you should have regular check-up appointments with your healthcare provider that is prescribing your Eliquis .  What do you do if you miss a dose? If a dose of ELIQUIS  is not taken at the scheduled time, take it as soon as possible on the same day and twice-daily administration should be resumed.  The dose should not be doubled to make up for a missed dose.  Do not take more than one tablet of ELIQUIS  at  the same time.  Important Safety Information A possible side effect of Eliquis  is bleeding. You should call your healthcare provider right away if you experience any of the following: Bleeding from an injury or your nose that does not stop. Unusual colored urine (red or dark brown) or unusual colored stools (red or black). Unusual bruising for unknown reasons. A serious fall or if you hit your head (even if there is no bleeding).  Some medicines may interact with Eliquis  and might increase your risk of bleeding or clotting while on Eliquis . To help avoid this, consult your healthcare provider or pharmacist prior to using any new prescription or non-prescription medications, including herbals, vitamins, non-steroidal anti-inflammatory drugs (NSAIDs) and supplements.  This website has more information on Eliquis  (apixaban ): http://www.eliquis .com/eliquis dena

## 2024-11-11 NOTE — Anesthesia Procedure Notes (Signed)
 Spinal  Patient location during procedure: OR End time: 11/11/2024 1:23 AM Reason for block: surgical anesthesia  Staffing Performed: resident/CRNA  Authorized by: Peggye Delon Brunswick, MD   Performed by: Taraoluwa Thakur, Clinical Cytogeneticist D, CRNA  Preanesthetic Checklist Completed: patient identified, IV checked, site marked, risks and benefits discussed, surgical consent, monitors and equipment checked, pre-op evaluation and timeout performed Spinal Block Patient position: sitting Prep: DuraPrep Patient monitoring: heart rate, continuous pulse ox and blood pressure Approach: midline Location: L3-4 Injection technique: single-shot Needle Needle type: Pencan  Needle gauge: 24 G Needle length: 9 cm Assessment Sensory level: T6 Events: CSF return

## 2024-11-11 NOTE — Anesthesia Postprocedure Evaluation (Signed)
"   Anesthesia Post Note  Patient: Robin Orozco  Procedure(s) Performed: ARTHROPLASTY, KNEE, TOTAL (Right: Knee)     Patient location during evaluation: PACU Anesthesia Type: MAC and Spinal Level of consciousness: awake Pain management: pain level controlled Vital Signs Assessment: post-procedure vital signs reviewed and stable Respiratory status: spontaneous breathing, respiratory function stable and nonlabored ventilation Cardiovascular status: blood pressure returned to baseline and stable Postop Assessment: no headache, no backache and no apparent nausea or vomiting Anesthetic complications: no   No notable events documented.  Last Vitals:  Vitals:   11/11/24 1700 11/11/24 1734  BP: 136/76   Pulse: 78 84  Resp: 19 19  Temp:    SpO2: 95% 95%    Last Pain:  Vitals:   11/11/24 1645  TempSrc:   PainSc: 3                  [name]      "

## 2024-11-11 NOTE — Interval H&P Note (Signed)
 History and Physical Interval Note:  11/11/2024 11:28 AM  Robin Orozco  has presented today for surgery, with the diagnosis of Primary osteoarthritis of right knee.  The various methods of treatment have been discussed with the patient and family. After consideration of risks, benefits and other options for treatment, the patient has consented to  Procedures: ARTHROPLASTY, KNEE, TOTAL (Right) as a surgical intervention.  The patient's history has been reviewed, patient examined, no change in status, stable for surgery.  I have reviewed the patient's chart and labs.  Questions were answered to the patient's satisfaction.     [name]

## 2024-11-11 NOTE — Anesthesia Procedure Notes (Signed)
 Anesthesia Regional Block: Adductor canal block   Pre-Anesthetic Checklist: , timeout performed,  Correct Patient, Correct Site, Correct Laterality,  Correct Procedure, Correct Position, site marked,  Risks and benefits discussed,  Surgical consent,  Pre-op evaluation,  At surgeon's request and post-op pain management  Laterality: Right  Prep: chloraprep       Needles:  Injection technique: Single-shot  Needle Type: Echogenic Stimulator Needle     Needle Length: 9cm  Needle Gauge: 21     Additional Needles:   Procedures:,,,, ultrasound used (permanent image in chart),,    Narrative:  Start time: 11/11/2024 10:23 AM End time: 11/11/2024 10:27 AM Injection made incrementally with aspirations every 5 mL.  Performed by: Personally  Anesthesiologist: Peggye Delon Brunswick, MD  Additional Notes: Discussed risks and benefits of nerve block including, but not limited to, prolonged and/or permanent nerve injury involving sensory and/or motor function. Monitors were applied and a time-out was performed. The nerve and associated structures were visualized under ultrasound guidance. After negative aspiration, local anesthetic was slowly injected around the nerve. There was no evidence of high pressure during the procedure. There were no paresthesias. VSS remained stable and the patient tolerated the procedure well.

## 2024-11-12 DIAGNOSIS — M1711 Unilateral primary osteoarthritis, right knee: Secondary | ICD-10-CM | POA: Diagnosis not present

## 2024-11-12 MED ORDER — APIXABAN 2.5 MG PO TABS
2.5000 mg | ORAL_TABLET | Freq: Two times a day (BID) | ORAL | Status: DC
  Administered 2024-11-12 – 2024-11-13 (×2): 2.5 mg via ORAL
  Filled 2024-11-12 (×2): qty 1

## 2024-11-12 MED ORDER — ERYTHROMYCIN 5 MG/GM OP OINT
TOPICAL_OINTMENT | Freq: Three times a day (TID) | OPHTHALMIC | Status: DC
  Filled 2024-11-12: qty 1
  Filled 2024-11-12: qty 3.5

## 2024-11-12 MED ORDER — POLYVINYL ALCOHOL 1.4 % OP SOLN
1.0000 [drp] | Freq: Four times a day (QID) | OPHTHALMIC | Status: DC
  Administered 2024-11-12 – 2024-11-13 (×4): 1 [drp] via OPHTHALMIC
  Filled 2024-11-12: qty 15

## 2024-11-12 NOTE — Progress Notes (Signed)
 Orthopedic Tech Progress Note Patient Details:  Robin Orozco May 23, 1976 [id number]  CPM Right Knee CPM Right Knee: On Right Knee Flexion (Degrees): 45 Right Knee Extension (Degrees): 10  Post Interventions Patient Tolerated: Well  Robin Orozco 11/12/2024, 3:38 PM
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
## 2024-11-12 NOTE — Progress Notes (Signed)
 Purewick initated due to incontinence episode. Patient reports she is unable to tell when she needs to urinate at this time.
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
## 2024-11-12 NOTE — Progress Notes (Addendum)
 Bard, RPh called to see if provider Kay is still on the floor and advised that he had already left and he can be reached by contacting the office phone. Pharmacist wanted to address fixing Eliquis  dose from 5 mg to 2.5 mg and adjusting the times. Bard, RPh advised to make the incoming nurse aware and she can contact him (RPh), if the patient is getting discharged early to address times to give medication and AVS is available for patient education regarding anticoagulation therapy.

## 2024-11-12 NOTE — Progress Notes (Signed)
 Dr. Arlyss King will be coming in for a consult for her right eye. Erythro ointment ordered TID. Do not expect her to discharge home today.
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
## 2024-11-13 DIAGNOSIS — M1711 Unilateral primary osteoarthritis, right knee: Secondary | ICD-10-CM | POA: Diagnosis not present

## 2024-11-13 MED ORDER — APIXABAN 2.5 MG PO TABS: 2.5000 mg | ORAL_TABLET | Freq: Two times a day (BID) | ORAL | 0 refills | Status: AC

## 2024-11-13 MED ORDER — POLYVINYL ALCOHOL 1.4 % OP SOLN: 1.0000 [drp] | Freq: Four times a day (QID) | OPHTHALMIC | 0 refills | Status: AC

## 2024-11-13 MED ORDER — ERYTHROMYCIN 5 MG/GM OP OINT: TOPICAL_OINTMENT | Freq: Three times a day (TID) | OPHTHALMIC | 0 refills | Status: AC

## 2024-11-13 NOTE — Plan of Care (Signed)

## 2024-11-13 NOTE — Progress Notes (Addendum)
" ° ° °  Subjective: Patient seen in rounds for Dr. Kay.  Patient reports pain as moderate.   She reports some vomiting this am with some nausea. She reports she has not had much of an appetite since she has been here, she has had some soup and crackers.  Denies CP/SOB/Abd pain.  She denies tingling or numbness in LE bilaterally.  Eye pain still present but slightly better, blurred vision.   Objective:   VITALS:   Vitals:   11/12/24 1318 11/12/24 1434 11/12/24 2200 11/13/24 0624  BP: 137/62 (!) 114/50 (!) 138/91 (!) 150/68  Pulse: 68 60 71 76  Resp:  19 17 15   Temp:  98.8 F (37.1 C) 98.4 F (36.9 C) 98.5 F (36.9 C)  TempSrc:  Oral Oral Oral  SpO2:  96% 98% 100%  Weight:      Height:        NAD Neurologically intact ABD soft Neurovascular intact Sensation intact distally Intact pulses distally Dorsiflexion/Plantar flexion intact Incision: dressing C/D/I No cellulitis present Compartment soft Patient blinking eyes which are red and irritated.   Lab Results  Component Value Date   WBC 4.9 10/31/2024   HGB 13.3 10/31/2024   HCT 40.2 10/31/2024   MCV 92.8 10/31/2024   PLT 286 10/31/2024   BMET    Component Value Date/Time   NA 140 10/31/2024 1050   K 4.1 10/31/2024 1050   CL 104 10/31/2024 1050   CO2 26 10/31/2024 1050   GLUCOSE 98 10/31/2024 1050   BUN 12 10/31/2024 1050   CREATININE 0.83 10/31/2024 1050   CALCIUM 9.9 10/31/2024 1050   GFRNONAA >60 10/31/2024 1050     Assessment/Plan: 2 Days Post-Op   Principal Problem:   S/P total knee arthroplasty, right Active Problems:   Status post total knee replacement, right   WBAT with walker DVT ppx: Eliquis , SCDs, TEDS PO pain control PT/OT: Exercises in bed yesterday. Ambulate with PT today.  Dispo:  - Continue antibiotic ointment TID and lubricating drops qid per optho consult.  - D/c pending pain control and PT clearance.    Robin Orozco Potters 11/13/2024, 9:24 AM   [facility] [address], [address], [place], [place] [address] Phone: [phone] [email or web address]  Runner, Broadcasting/film/video      Patient doing better this PM. Eyes are better but still foggy and not pain free. She has good family support at home. Discussed with PT who feels like the patient will do better at home in terms of mobilization. Discussed with patient who agrees with the plan for discharge this PM once she has her PT session this afternoon.   Rx update to include optho drops   Follow up in two weeks  Robin Kay MD  [facility]   "
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
## 2024-11-14 ENCOUNTER — Encounter (HOSPITAL_COMMUNITY): Payer: Self-pay | Admitting: Orthopedic Surgery
